# Patient Record
Sex: Female | Born: 1990
Health system: Southern US, Community
[De-identification: ages and names within clinical notes are randomized; demographics above are authoritative.]

## PROBLEM LIST (undated history)

## (undated) DIAGNOSIS — U071 COVID-19: Secondary | ICD-10-CM

## (undated) DIAGNOSIS — F32A Depression, unspecified: Secondary | ICD-10-CM

## (undated) DIAGNOSIS — F329 Major depressive disorder, single episode, unspecified: Secondary | ICD-10-CM

## (undated) HISTORY — DX: Depression, unspecified: F32.A

## (undated) HISTORY — DX: Major depressive disorder, single episode, unspecified: F32.9

---

## 2016-05-10 LAB — HM PAP SMEAR: HM Pap smear: NEGATIVE

## 2017-10-13 ENCOUNTER — Ambulatory Visit: Payer: Self-pay | Admitting: Family Medicine

## 2017-11-10 ENCOUNTER — Ambulatory Visit (INDEPENDENT_AMBULATORY_CARE_PROVIDER_SITE_OTHER): Payer: 59 | Admitting: Family Medicine

## 2017-11-10 ENCOUNTER — Encounter: Payer: Self-pay | Admitting: Family Medicine

## 2017-11-10 ENCOUNTER — Encounter (INDEPENDENT_AMBULATORY_CARE_PROVIDER_SITE_OTHER): Payer: Self-pay

## 2017-11-10 VITALS — BP 118/72 | HR 84 | Temp 98.1°F | Ht 62.25 in | Wt 314.8 lb

## 2017-11-10 DIAGNOSIS — Z720 Tobacco use: Secondary | ICD-10-CM

## 2017-11-10 DIAGNOSIS — Z113 Encounter for screening for infections with a predominantly sexual mode of transmission: Secondary | ICD-10-CM

## 2017-11-10 DIAGNOSIS — Z7689 Persons encountering health services in other specified circumstances: Secondary | ICD-10-CM

## 2017-11-10 DIAGNOSIS — Z0000 Encounter for general adult medical examination without abnormal findings: Secondary | ICD-10-CM

## 2017-11-10 LAB — COMPREHENSIVE METABOLIC PANEL
ALBUMIN: 3.9 g/dL (ref 3.5–5.2)
ALK PHOS: 78 U/L (ref 39–117)
ALT: 21 U/L (ref 0–35)
AST: 13 U/L (ref 0–37)
BUN: 11 mg/dL (ref 6–23)
CO2: 27 mEq/L (ref 19–32)
Calcium: 9 mg/dL (ref 8.4–10.5)
Chloride: 105 mEq/L (ref 96–112)
Creatinine, Ser: 0.76 mg/dL (ref 0.40–1.20)
GFR: 115.74 mL/min (ref >60.00)
GLUCOSE: 103 mg/dL — AB (ref 70–99)
POTASSIUM: 4 meq/L (ref 3.5–5.1)
SODIUM: 138 meq/L (ref 135–145)
TOTAL PROTEIN: 7.7 g/dL (ref 6.0–8.3)
Total Bilirubin: 0.2 mg/dL (ref 0.2–1.2)

## 2017-11-10 LAB — CBC WITH DIFFERENTIAL/PLATELET
BASOS PCT: 0.5 % (ref 0.0–3.0)
Basophils Absolute: 0 10*3/uL (ref 0.0–0.1)
EOS PCT: 1.4 % (ref 0.0–5.0)
Eosinophils Absolute: 0.1 10*3/uL (ref 0.0–0.7)
HCT: 37.2 % (ref 36.0–46.0)
Hemoglobin: 12.1 g/dL (ref 12.0–15.0)
LYMPHS ABS: 2 10*3/uL (ref 0.7–4.0)
Lymphocytes Relative: 23.3 % (ref 12.0–46.0)
MCHC: 32.6 g/dL (ref 30.0–36.0)
MCV: 76.9 fl — AB (ref 78.0–100.0)
MONO ABS: 0.7 10*3/uL (ref 0.1–1.0)
Monocytes Relative: 8.5 % (ref 3.0–12.0)
NEUTROS ABS: 5.6 10*3/uL (ref 1.4–7.7)
NEUTROS PCT: 66.3 % (ref 43.0–77.0)
Platelets: 290 10*3/uL (ref 150.0–400.0)
RBC: 4.83 Mil/uL (ref 3.87–5.11)
RDW: 16.1 % — AB (ref 11.5–15.5)
WBC: 8.5 10*3/uL (ref 4.0–10.5)

## 2017-11-10 LAB — LIPID PANEL
CHOL/HDL RATIO: 4
Cholesterol: 162 mg/dL (ref 0–200)
HDL: 43.9 mg/dL (ref >39.00)
LDL Cholesterol: 102 mg/dL — ABNORMAL HIGH (ref 0–99)
NonHDL: 118.11
TRIGLYCERIDES: 80 mg/dL (ref 0.0–149.0)
VLDL: 16 mg/dL (ref 0.0–40.0)

## 2017-11-10 LAB — TSH: TSH: 2.26 u[IU]/mL (ref 0.35–4.50)

## 2017-11-10 LAB — VITAMIN D 25 HYDROXY (VIT D DEFICIENCY, FRACTURES): VITD: <7 ng/mL — ABNORMAL LOW (ref 30.00–100.00)

## 2017-11-10 LAB — HEMOGLOBIN A1C: HEMOGLOBIN A1C: 5.8 % (ref 4.6–6.5)

## 2017-11-10 NOTE — Patient Instructions (Signed)
Great to see you today  Please follow up in 6 months  I will notify you of lab results  Work to eat 50-60 grams of protein daily (lean meats, eggs, greek yogurt, cottage cheese, whey protein shakes), eat 5-7 servings of vegetables a day and 1-2 servings of fruit daily. Eat whole grains. Aim for 1/2 to 1 pound a week  Keeping You Healthy  Get These Tests 1. Blood Pressure- Have your blood pressure checked once a year by your health care provider.  Normal blood pressure is 120/80. 2. Weight- Have your body mass index (BMI) calculated to screen for obesity.  BMI is measure of body fat based on height and weight.  You can also calculate your own BMI at https://www.west-esparza.com/www.nhlbisupport.com/bmi/. 3. Cholesterol- Have your cholesterol checked every 5 years starting at age 920 then yearly starting at age 29. 4. Chlamydia, HIV, and other sexually transmitted diseases- Get screened every year until age 29, then within three months of each new sexual provider. 5. Pap Test - Every 1-5 years; discuss with your health care provider. 6. Mammogram- Every 1-2 years starting at age 29--50  Take these medicines  Calcium with Vitamin D-Your body needs 1200 mg of Calcium each day and [phone] IU of Vitamin D daily.  Your body can only absorb 500 mg of Calcium at a time so Calcium must be taken in 2 or 3 divided doses throughout the day.  Multivitamin with folic acid- Once daily if it is possible for you to become pregnant.  Get these Immunizations  Gardasil-Series of three doses; prevents HPV related illness such as genital warts and cervical cancer.  Menactra-Single dose; prevents meningitis.  Tetanus shot- Every 10 years.  Flu shot-Every year.  Take these steps 1. Do not smoke-Your healthcare provider can help you quit.  For tips on how to quit go to www.smokefree.gov or call 1-800 QUITNOW. 2. Be physically active- Exercise 5 days a week for at least 30 minutes.  If you are not already physically active, start slow  and gradually work up to 30 minutes of moderate physical activity.  Examples of moderate activity include walking briskly, dancing, swimming, bicycling, etc. 3. Breast Cancer- A self breast exam every month is important for early detection of breast cancer.  For more information and instruction on self breast exams, ask your healthcare provider or [place].eswww.womenshealth.gov/faq/breast-self-exam.cfm. 4. Eat a healthy diet- Eat a variety of healthy foods such as fruits, vegetables, whole grains, low fat milk, low fat cheeses, yogurt, lean meats, poultry and fish, beans, nuts, tofu, etc.  For more information go to www. Thenutritionsource.org 5. Drink alcohol in moderation- Limit alcohol intake to one drink or less per day. Never drink and drive. 6. Depression- Your emotional health is as important as your physical health.  If you're feeling down or losing interest in things you normally enjoy please talk to your healthcare provider about being screened for depression. 7. Dental visit- Brush and floss your teeth twice daily; visit your dentist twice a year. 8. Eye doctor- Get an eye exam at least every 2 years. 9. Helmet use- Always wear a helmet when riding a bicycle, motorcycle, rollerblading or skateboarding. 10. Safe sex- If you may be exposed to sexually transmitted infections, use a condom. 11. Seat belts- Seat belts can save your live; always wear one. 12. Smoke/Carbon Monoxide detectors- These detectors need to be installed on the appropriate level of your home. Replace batteries at least once a year. 13. Skin cancer- When out in  the sun please cover up and use sunscreen 15 SPF or higher. 14. Violence- If anyone is threatening or hurting you, please tell your healthcare provider.

## 2017-11-10 NOTE — Progress Notes (Signed)
Subjective:    Patient ID: Valerie Mccarthy, female    DOB: 1990/12/06, 27 y.o.   MRN: [id number]  HPI This is a 27 yo female who presents today to establish care. Recently moved to area from [place]. 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 Lives with her mother who is disabled. They are currently living in a hotel while awaiting more permanent housing. Haven't been able to cook. She enjoys movies, socializing with friends. She would like to have her own catering business one day.   Last CPE- last year, no recent blood work Pap- last year Sex- not currently sexually active, has been in past, would like std testing Tdap- unknown Flu- annual Eye- last year Dental- over due Exercise- going to gym 3-5 days a week, does treadmill and elipitcal.  Diet- currently doing intermittent fasting. Eats salad for lunch, usually food from work for dinner. Snacks on chips or cookies. Has recently stopped drinking soda (was drinking 2-3 a day), little juice, mostly drinks water.  Tobacco abuse- doing smoking cessation class at [facility], mother also smokes but is supportive of her quitting.    Past Medical History:  Diagnosis Date  . Depression    History reviewed. No pertinent surgical history. Family History  Problem Relation Age of Onset  . Obesity Mother   . Diabetes Mother   . Arthritis Mother   . Lupus Mother   . Heart disease Father    Social History   Tobacco Use  . Smoking status: Light Tobacco Smoker  . Smokeless tobacco: Never Used  Substance Use Topics  . Alcohol use: Yes    Comment: 2-3 weekly   . Drug use: Never      Review of Systems  Constitutional: Negative.   HENT: Negative.   Eyes: Negative.        Wears glasses.   Respiratory: Negative.   Cardiovascular: Negative.   Gastrointestinal: Negative.   Endocrine: Negative.   Genitourinary: Negative.   Musculoskeletal: Negative.   Allergic/Immunologic: Negative.   Neurological: Positive  for headaches (1-2 per week, usually related to stress, have decreased since getting glasses. ).  Hematological: Negative.   Psychiatric/Behavioral: Negative.        Objective:   Physical Exam Physical Exam  Constitutional: She is oriented to person, place, and time. She appears well-developed and well-nourished. No distress.  HENT:  Head: Normocephalic and atraumatic.  Right Ear: External ear normal.  Left Ear: External ear normal.  Nose: Nose normal.  Mouth/Throat: Oropharynx is clear and moist. No oropharyngeal exudate.  Eyes: Conjunctivae are normal. Pupils are equal, round, and reactive to light.  Neck: Normal range of motion. Neck supple. No JVD present. No thyromegaly present.  Cardiovascular: Normal rate, regular rhythm, normal heart sounds and intact distal pulses.   Pulmonary/Chest: Effort normal and breath sounds normal. Right breast exhibits no inverted nipple, no mass, no nipple discharge, no skin change and no tenderness. Left breast exhibits no inverted nipple, no mass, no nipple discharge, no skin change and no tenderness. Breasts are symmetrical.  Abdominal: Soft. Bowel sounds are normal. She exhibits no distension and no mass. There is no tenderness. There is no rebound and no guarding.  Musculoskeletal: Normal range of motion. She exhibits no edema or tenderness.  Lymphadenopathy:    She has no cervical adenopathy.  Neurological: She is alert and oriented to person, place, and time. She has normal reflexes.  Skin: Skin is warm and dry. She is not diaphoretic.  Psychiatric:  She has a normal mood and affect. Her behavior is normal. Judgment and thought content normal.  Vitals reviewed.     BP 118/72 (BP Location: Right Arm, Patient Position: Sitting, Cuff Size: Large)   Pulse 84   Temp 98.1 F (36.7 C) (Oral)   Ht 5' 2.25" (1.581 m)   Wt (!) 314 lb 12 oz (142.8 kg)   LMP 11/03/2017   SpO2 99%   BMI 57.11 kg/m  Depression screen PHQ 2/9 11/10/2017  Decreased  Interest 0  Down, Depressed, Hopeless 0  PHQ - 2 Score 0       Assessment & Plan:  1. Encounter to establish care - Discussed and encouraged healthy lifestyle choices- adequate sleep, regular exercise, stress management and healthy food choices.  - will request records from prior PCP  2. Screening for STD (sexually transmitted disease) - RPR - HIV antibody - C. trachomatis/N. gonorrhoeae RNA  3. Morbid obesity (HCC) - TSH - CBC with Differential - Comprehensive metabolic panel - Hemoglobin A1c - Vitamin D, 25-hydroxy - Lipid Panel - discussed weight loss of 1/2 -1 pound per week, encouraged her to increase protein and vegetable intake, continue to abstain from soda/juice.  - follow up in 6 months to check on progress  4. Tobacco abuse - encouraged her to continue participation in smoking cessation program  Olean Reeeborah Phuong Moffatt, FNP-BC  [facility], [facility]  11/10/2017 10:52 AM

## 2017-11-14 LAB — C. TRACHOMATIS/N. GONORRHOEAE RNA
C. trachomatis RNA, TMA: NOT DETECTED
N. gonorrhoeae RNA, TMA: NOT DETECTED

## 2017-11-14 LAB — HIV ANTIBODY (ROUTINE TESTING W REFLEX): HIV 1&2 Ab, 4th Generation: NONREACTIVE

## 2017-11-14 LAB — RPR: RPR Ser Ql: NONREACTIVE

## 2017-11-15 MED ORDER — VITAMIN D (ERGOCALCIFEROL) 1.25 MG (50000 UNIT) PO CAPS: 50000.0000 [IU] | ORAL_CAPSULE | ORAL | 3 refills | Status: DC

## 2017-11-15 NOTE — Addendum Note (Signed)
Addended by: Olean ReeGESSNER, DEBORAH B on: 11/15/2017 08:18 AM   Modules accepted: Orders

## 2017-11-16 ENCOUNTER — Encounter: Payer: Self-pay | Admitting: Family Medicine

## 2017-11-22 ENCOUNTER — Emergency Department
Admission: EM | Admit: 2017-11-22 | Discharge: 2017-11-22 | Disposition: A | Discharge status: Home / Self Care | Payer: 59 | Attending: Emergency Medicine | Admitting: Emergency Medicine

## 2017-11-22 ENCOUNTER — Encounter: Payer: Self-pay | Admitting: Emergency Medicine

## 2017-11-22 ENCOUNTER — Other Ambulatory Visit: Payer: Self-pay

## 2017-11-22 DIAGNOSIS — F329 Major depressive disorder, single episode, unspecified: Secondary | ICD-10-CM | POA: Diagnosis not present

## 2017-11-22 DIAGNOSIS — F17200 Nicotine dependence, unspecified, uncomplicated: Secondary | ICD-10-CM | POA: Insufficient documentation

## 2017-11-22 DIAGNOSIS — F331 Major depressive disorder, recurrent, moderate: Secondary | ICD-10-CM

## 2017-11-22 DIAGNOSIS — F32A Depression, unspecified: Secondary | ICD-10-CM

## 2017-11-22 DIAGNOSIS — R45851 Suicidal ideations: Secondary | ICD-10-CM | POA: Insufficient documentation

## 2017-11-22 LAB — CBC
HCT: 34.4 % — ABNORMAL LOW (ref 35.0–47.0)
Hemoglobin: 11.6 g/dL — ABNORMAL LOW (ref 12.0–16.0)
MCH: 25.5 pg — AB (ref 26.0–34.0)
MCHC: 33.8 g/dL (ref 32.0–36.0)
MCV: 75.6 fL — ABNORMAL LOW (ref 80.0–100.0)
Platelets: 311 10*3/uL (ref 150–440)
RBC: 4.54 MIL/uL (ref 3.80–5.20)
RDW: 16 % — AB (ref 11.5–14.5)
WBC: 12.2 10*3/uL — ABNORMAL HIGH (ref 3.6–11.0)

## 2017-11-22 LAB — URINE DRUG SCREEN, QUALITATIVE (ARMC ONLY)
Amphetamines, Ur Screen: NOT DETECTED
BARBITURATES, UR SCREEN: NOT DETECTED
BENZODIAZEPINE, UR SCRN: NOT DETECTED
COCAINE METABOLITE, UR ~~LOC~~: NOT DETECTED
Cannabinoid 50 Ng, Ur ~~LOC~~: NOT DETECTED
MDMA (Ecstasy)Ur Screen: NOT DETECTED
Methadone Scn, Ur: NOT DETECTED
Opiate, Ur Screen: NOT DETECTED
PHENCYCLIDINE (PCP) UR S: NOT DETECTED
Tricyclic, Ur Screen: NOT DETECTED

## 2017-11-22 LAB — COMPREHENSIVE METABOLIC PANEL
ALT: 34 U/L (ref 0–44)
AST: 20 U/L (ref 15–41)
Albumin: 3.6 g/dL (ref 3.5–5.0)
Alkaline Phosphatase: 75 U/L (ref 38–126)
Anion gap: 6 (ref 5–15)
BUN: 13 mg/dL (ref 6–20)
CHLORIDE: 109 mmol/L (ref 98–111)
CO2: 24 mmol/L (ref 22–32)
CREATININE: 0.79 mg/dL (ref 0.44–1.00)
Calcium: 8.7 mg/dL — ABNORMAL LOW (ref 8.9–10.3)
GFR calc Af Amer: >60 mL/min (ref >60)
GFR calc non Af Amer: >60 mL/min (ref >60)
Glucose, Bld: 128 mg/dL — ABNORMAL HIGH (ref 70–99)
Potassium: 4 mmol/L (ref 3.5–5.1)
Sodium: 139 mmol/L (ref 135–145)
Total Bilirubin: 0.4 mg/dL (ref 0.3–1.2)
Total Protein: 7.5 g/dL (ref 6.5–8.1)

## 2017-11-22 LAB — PREGNANCY, URINE: Preg Test, Ur: NEGATIVE

## 2017-11-22 LAB — ETHANOL

## 2017-11-22 LAB — SALICYLATE LEVEL: Salicylate Lvl: <7 mg/dL (ref 2.8–30.0)

## 2017-11-22 LAB — ACETAMINOPHEN LEVEL: Acetaminophen (Tylenol), Serum: <10 ug/mL — ABNORMAL LOW (ref 10–30)

## 2017-11-22 NOTE — ED Notes (Signed)
Patient given phone to call supervisor, patient was not able to get in contact with supervisor, she said she will try again later.

## 2017-11-22 NOTE — Discharge Instructions (Signed)
Please follow-up with employee assistance and/or [facility].  Please return for any further problems.

## 2017-11-22 NOTE — BH Assessment (Signed)
Assessment Note  Valerie Mccarthy is an 27 y.o. female brought to ED via [facility] voluntary. Pt reports that she recently had suicidal thoughts that she expressed to a friend due to stress associated with loss and family conflicts. Pt reports that her friend then called the police out of concern. Pt tearful, coherent, and alert during assessment. Pt reports that she has a difficult relationship with her mom and wishes it were better. Pt lost her father two years ago and endorses many symptoms of grief. Pt feels her mother is withdrawn and cold due to loss of her father and her grandmother being killed by her grandfather when her mother was a teenager. Pt feels her mother constantly puts her down and argues with her which contributes to her feelings of depression. At time of assessment, pt endorsed no SI, HI, AH, and VH. Pt denies any access to weapons or prior attempts.  Diagnosis: Depression  Past Medical History:  Past Medical History:  Diagnosis Date  . Depression     History reviewed. No pertinent surgical history.  Family History:  Family History  Problem Relation Age of Onset  . Obesity Mother   . Diabetes Mother   . Arthritis Mother   . Lupus Mother   . Heart disease Father     Social History:  reports that she has been smoking.  She has never used smokeless tobacco. She reports that she drinks alcohol. She reports that she does not use drugs.  Additional Social History:  Alcohol / Drug Use Pain Medications: see PTA Prescriptions: see PTA Over the Counter: see PTA History of alcohol / drug use?: No history of alcohol / drug abuse  CIWA:   COWS:    Allergies: No Known Allergies  Home Medications:  (Not in a hospital admission)  OB/GYN Status:  Patient's last menstrual period was 11/03/2017 (exact date).  General Assessment Data Location of Assessment: [facility] ED TTS Assessment: In system Is this a Tele or Face-to-Face Assessment?: Face-to-Face Is this an Initial Assessment  or a Re-assessment for this encounter?: Initial Assessment Marital status: Single Is patient pregnant?: No Pregnancy Status: No Living Arrangements: Parent Can pt return to current living arrangement?: Yes Admission Status: Voluntary Is patient capable of signing voluntary admission?: Yes Referral Source: Self/Family/Friend Insurance type: [employer]  Medical Screening Exam [facility] Walk-in ONLY) Medical Exam completed: Yes  Crisis Care Plan Living Arrangements: Parent Legal Guardian: Other:(self) Name of Psychiatrist: none Name of Therapist: none  Education Status Is patient currently in school?: No Is the patient employed, unemployed or receiving disability?: Employed  Risk to self with the past 6 months Suicidal Ideation: No-Not Currently/Within Last 6 Months Has patient been a risk to self within the past 6 months prior to admission? : No Suicidal Intent: No Has patient had any suicidal intent within the past 6 months prior to admission? : No Is patient at risk for suicide?: No, but patient needs Medical Clearance Suicidal Plan?: No Has patient had any suicidal plan within the past 6 months prior to admission? : No Access to Means: No What has been your use of drugs/alcohol within the last 12 months?: none Previous Attempts/Gestures: No How many times?: 0 Other Self Harm Risks: none reported Triggers for Past Attempts: None known Intentional Self Injurious Behavior: None Family Suicide History: No Recent stressful life event(s): Conflict (Comment), Loss (Comment), Financial Problems Persecutory voices/beliefs?: No Depression: Yes Depression Symptoms: Despondent, Insomnia, Tearfulness, Isolating, Loss of interest in usual pleasures, Feeling worthless/self pity  Substance abuse history and/or treatment for substance abuse?: No Suicide prevention information given to non-admitted patients: Not applicable  Risk to Others within the past 6 months Homicidal Ideation:  No Does patient have any lifetime risk of violence toward others beyond the six months prior to admission? : No Thoughts of Harm to Others: No Current Homicidal Intent: No Current Homicidal Plan: No Access to Homicidal Means: No Identified Victim: None identified History of harm to others?: No Assessment of Violence: None Noted Violent Behavior Description: none noted Does patient have access to weapons?: No Criminal Charges Pending?: No Does patient have a court date: No Is patient on probation?: No  Psychosis Hallucinations: None noted Delusions: None noted  Mental Status Report Appearance/Hygiene: Unremarkable Eye Contact: Good Motor Activity: Freedom of movement Speech: Logical/coherent Level of Consciousness: Alert Mood: Depressed Affect: Appropriate to circumstance Anxiety Level: None Thought Processes: Coherent, Relevant Judgement: Unimpaired Orientation: Appropriate for developmental age Obsessive Compulsive Thoughts/Behaviors: None  Cognitive Functioning Concentration: Good Memory: Recent Intact, Remote Intact Is patient IDD: No Is patient DD?: No Insight: Fair Impulse Control: Fair Appetite: Good Have you had any weight changes? : No Change Sleep: Decreased Total Hours of Sleep: 6 Vegetative Symptoms: None  ADLScreening [facility] Assessment Services) Patient's cognitive ability adequate to safely complete daily activities?: Yes Patient able to express need for assistance with ADLs?: Yes Independently performs ADLs?: Yes (appropriate for developmental age)  Prior Inpatient Therapy Prior Inpatient Therapy: No  Prior Outpatient Therapy Prior Outpatient Therapy: No Does patient have an ACCT team?: No Does patient have Intensive In-House Services?  : No Does patient have [facility] services? : No Does patient have P4CC services?: No  ADL Screening (condition at time of admission) Patient's cognitive ability adequate to safely complete daily activities?:  Yes Is the patient deaf or have difficulty hearing?: No Does the patient have difficulty seeing, even when wearing glasses/contacts?: No Does the patient have difficulty concentrating, remembering, or making decisions?: No Patient able to express need for assistance with ADLs?: Yes Does the patient have difficulty dressing or bathing?: No Independently performs ADLs?: Yes (appropriate for developmental age) Does the patient have difficulty walking or climbing stairs?: No Weakness of Legs: None Weakness of Arms/Hands: None  Home Assistive Devices/Equipment Home Assistive Devices/Equipment: None  Therapy Consults (therapy consults require a physician order) PT Evaluation Needed: No OT Evalulation Needed: No SLP Evaluation Needed: No Abuse/Neglect Assessment (Assessment to be complete while patient is alone) Abuse/Neglect Assessment Can Be Completed: Yes Physical Abuse: Denies Verbal Abuse: Denies Sexual Abuse: Denies Exploitation of patient/patient's resources: Denies Self-Neglect: Denies Values / Beliefs Cultural Requests During Hospitalization: None Spiritual Requests During Hospitalization: None Consults Spiritual Care Consult Needed: No Social Work Consult Needed: No Merchant navy officerAdvance Directives (For Healthcare) Does Patient Have a Medical Advance Directive?: No Would patient like information on creating a medical advance directive?: No - Patient declined    Additional Information 1:1 In Past 12 Months?: No CIRT Risk: No Elopement Risk: No Does patient have medical clearance?: Yes     Disposition:  Disposition Initial Assessment Completed for this Encounter: Yes Disposition of Patient: (pending disposition) Patient refused recommended treatment: No Mode of transportation if patient is discharged?: Car Patient referred to: (pending disposition)  On Site Evaluation by:   Reviewed with Physician:    Aubery LappingJerrica  Brielynn Sekula, MS, [facility] 11/22/2017 6:41 AM

## 2017-11-22 NOTE — ED Notes (Signed)
Hourly rounding reveals patient sleeping in room. No complaints, stable, in no acute distress. Q15 minute rounds and monitoring via Security to continue. 

## 2017-11-22 NOTE — ED Notes (Signed)
Pt. Alert and oriented, warm and dry, in no distress. Pt. Denies HI, and AVH. Pt states having SI without a plan. Patient verbally contracts with this Clinical research associatewriter for safety. Pt states she has recently moved in with her mom and since they have moved into an hotel trying to find somewhere else better to live than previous place. Patient states she has tried to talk with her mom, but her mother just shuts it down and does not want to take about what is bothering her. Patient states she is wanting help for depression. Pt. Encouraged to let nursing staff know of any concerns or needs.

## 2017-11-22 NOTE — ED Notes (Signed)
EDT, Beth, in triage for protocols; Grey short-sleeve shirt, black pants, underwear, black jacket, black flip flops, striped purse removed and placed in labeled pt belonging bag to be secured on nursing unit; pt dressed in burgandy scrubs; pt's mother given pt password and list of behav unit visitation guidelines per pt's permission

## 2017-11-22 NOTE — ED Notes (Signed)
ED Provider at bedside. 

## 2017-11-22 NOTE — ED Notes (Signed)
Valerie ButtnerMonisa Mccarthy [id number]([phone]); pt's mother reports this is 2nd anniversary of her father's death and the end of a 7year relationship and has been struggling since; [facility] but was only an outpatient visit; moved here in November to stay with her mom

## 2017-11-22 NOTE — ED Triage Notes (Addendum)
Patient ambulatory to triage with steady gait, without difficulty or distress noted; pt reports suicidal thoughts; pt reports hx of depression but never rx meds for such

## 2017-11-22 NOTE — ED Notes (Signed)
Patient discharged home, patient received discharge papers. Patient received belongings and verbalized she has received all of her belongings. Patient appropriate and cooperative, Denies SI/HI AVH. Vital signs taken. NAD noted. 

## 2017-11-22 NOTE — Consult Note (Signed)
[facility] Consult   Reason for Consult: Consult for this 27 year old woman who came to the emergency room after referral by her friend because of concern about suicidal ideation Referring Physician: Rip Harbour Patient Identification: Valerie Mccarthy MRN:  [id number] Principal Diagnosis: Moderate recurrent major depression ([place]) Diagnosis:   Patient Active Problem List   Diagnosis Date Noted  . Moderate recurrent major depression ([place]) [F33.1] 11/22/2017    Total Time spent with patient: 1 hour  Subjective:   Valerie Mccarthy is a 27 y.o. female patient admitted with "my friend was just worried about me" patient interviewed chart reviewed.  Patient reports that she has been feeling down and depressed intermittently for many months now.  She does not feel constantly depressed but her mood will come and go.  She.  HPI: Feels that most of her mood problems come from the stress she is under living at home.  Patient lives with her mother and her stepfather and they are all currently staying in a hotel room.  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  She feels like she is under a lot of stress.  She feels like she has made a lot of sacrifices to try to take care of her mother and does not get any appreciation for it.  Patient is not sleeping as well as she could.  Appetite is okay.  She admits that she had said to her friend that she had some thoughts about "not wanting to be here" but denies that she had any plan or intention of hurting herself.  Denies to me any actual suicidal ideation and has positive things she can site in her life.  She drinks only occasionally and was not drinking yesterday.  Denies other drug use.  Not currently getting any outpatient mental health treatment.  No psychotic symptoms.  Social history: Patient is working but lives with her disabled mother and some other members of the extended family and what sounds like pretty stressful circumstances.   Patient told me a little bit more about her early life and it sounds like there is been a lot of stress and struggle that she has gone through for years.  Medical history: Overweight  Substance abuse history: Occasional alcohol but none on the screen when she came in and drug screen was negative.  Past Psychiatric History: Patient has no past history of any psychiatric evaluation or treatment.  No history of suicide attempts or self injury.  No history of psychiatric medication.  Risk to Self: Suicidal Ideation: No-Not Currently/Within Last 6 Months Suicidal Intent: No Is patient at risk for suicide?: No, but patient needs Medical Clearance Suicidal Plan?: No Access to Means: No What has been your use of drugs/alcohol within the last 12 months?: none How many times?: 0 Other Self Harm Risks: none reported Triggers for Past Attempts: None known Intentional Self Injurious Behavior: None Risk to Others: Homicidal Ideation: No Thoughts of Harm to Others: No Current Homicidal Intent: No Current Homicidal Plan: No Access to Homicidal Means: No Identified Victim: None identified History of harm to others?: No Assessment of Violence: None Noted Violent Behavior Description: none noted Does patient have access to weapons?: No Criminal Charges Pending?: No Does patient have a court date: No Prior Inpatient Therapy: Prior Inpatient Therapy: No Prior Outpatient Therapy: Prior Outpatient Therapy: No Does patient have an ACCT team?: No Does patient have Intensive In-House Services?  : No Does patient have [facility] services? : No Does patient have  P4CC services?: No  Past Medical History:  Past Medical History:  Diagnosis Date  . Depression    History reviewed. No pertinent surgical history. Family History:  Family History  Problem Relation Age of Onset  . Obesity Mother   . Diabetes Mother   . Arthritis Mother   . Lupus Mother   . Heart disease Father    Family Psychiatric   History: No known family history of mental health problems Social History:  Social History   Substance and Sexual Activity  Alcohol Use Yes   Comment: 2-3 weekly      Social History   Substance and Sexual Activity  Drug Use Never    Social History   Socioeconomic History  . Marital status: Single    Spouse name: Not on file  . Number of children: Not on file  . Years of education: Not on file  . Highest education level: Not on file  Occupational History  . Not on file  Social Needs  . Financial resource strain: Not on file  . Food insecurity:    Worry: Not on file    Inability: Not on file  . Transportation needs:    Medical: Not on file    Non-medical: Not on file  Tobacco Use  . Smoking status: Light Tobacco Smoker  . Smokeless tobacco: Never Used  Substance and Sexual Activity  . Alcohol use: Yes    Comment: 2-3 weekly   . Drug use: Never  . Sexual activity: Yes    Partners: Male    Birth control/protection: None  Lifestyle  . Physical activity:    Days per week: Not on file    Minutes per session: Not on file  . Stress: Not on file  Relationships  . Social connections:    Talks on phone: Not on file    Gets together: Not on file    Attends religious service: Not on file    Active member of club or organization: Not on file    Attends meetings of clubs or organizations: Not on file    Relationship status: Not on file  Other Topics Concern  . Not on file  Social History Narrative  . Not on file   Additional Social History:    Allergies:  No Known Allergies  Labs:  Results for orders placed or performed during the hospital encounter of 11/22/17 (from the past 48 hour(s))  Comprehensive metabolic panel     Status: Abnormal   Collection Time: 11/22/17  2:35 AM  Result Value Ref Range   Sodium 139 135 - 145 mmol/L   Potassium 4.0 3.5 - 5.1 mmol/L   Chloride 109 98 - 111 mmol/L   CO2 24 22 - 32 mmol/L   Glucose, Bld 128 (H) 70 - 99 mg/dL   BUN 13  6 - 20 mg/dL   Creatinine, Ser 0.79 0.44 - 1.00 mg/dL   Calcium 8.7 (L) 8.9 - 10.3 mg/dL   Total Protein 7.5 6.5 - 8.1 g/dL   Albumin 3.6 3.5 - 5.0 g/dL   AST 20 15 - 41 U/L   ALT 34 0 - 44 U/L   Alkaline Phosphatase 75 38 - 126 U/L   Total Bilirubin 0.4 0.3 - 1.2 mg/dL   GFR calc non Af Amer >60 >60 mL/min   GFR calc Af Amer >60 >60 mL/min    Comment: (NOTE) The eGFR has been calculated using the CKD EPI equation. This calculation has not been validated in all  clinical situations. eGFR's persistently <60 mL/min signify possible Chronic Kidney Disease.    Anion gap 6 5 - 15    Comment: Performed at [facility], [address] [place], [place] [address]  Ethanol     Status: None   Collection Time: 11/22/17  2:35 AM  Result Value Ref Range   Alcohol, Ethyl (B) <10 <10 mg/dL    Comment: (NOTE) Lowest detectable limit for serum alcohol is 10 mg/dL. For medical purposes only. Performed at [facility], [address] [place], [place] [address]   Salicylate level     Status: None   Collection Time: 11/22/17  2:35 AM  Result Value Ref Range   Salicylate Lvl <3.2 2.8 - 30.0 mg/dL    Comment: Performed at [facility], [address] [place], [place] [address]  Acetaminophen level     Status: Abnormal   Collection Time: 11/22/17  2:35 AM  Result Value Ref Range   Acetaminophen (Tylenol), Serum <10 (L) 10 - 30 ug/mL    Comment: (NOTE) Therapeutic concentrations vary significantly. A range of 10-30 ug/mL  may be an effective concentration for many patients. However, some  are best treated at concentrations outside of this range. Acetaminophen concentrations >150 ug/mL at 4 hours after ingestion  and >50 ug/mL at 12 hours after ingestion are often associated with  toxic reactions. Performed at [facility], [address] [place], [place] [address]   cbc     Status: Abnormal   Collection Time: 11/22/17  2:35 AM  Result Value Ref Range    WBC 12.2 (H) 3.6 - 11.0 K/uL   RBC 4.54 3.80 - 5.20 MIL/uL   Hemoglobin 11.6 (L) 12.0 - 16.0 g/dL   HCT 34.4 (L) 35.0 - 47.0 %   MCV 75.6 (L) 80.0 - 100.0 fL   MCH 25.5 (L) 26.0 - 34.0 pg   MCHC 33.8 32.0 - 36.0 g/dL   RDW 16.0 (H) 11.5 - 14.5 %   Platelets 311 150 - 440 K/uL    Comment: Performed at [facility], [address], [place],  [address]  Urine Drug Screen, Qualitative     Status: None   Collection Time: 11/22/17  2:41 AM  Result Value Ref Range   Tricyclic, Ur Screen NONE DETECTED NONE DETECTED   Amphetamines, Ur Screen NONE DETECTED NONE DETECTED   MDMA (Ecstasy)Ur Screen NONE DETECTED NONE DETECTED   Cocaine Metabolite,Ur [place] NONE DETECTED NONE DETECTED   Opiate, Ur Screen NONE DETECTED NONE DETECTED   Phencyclidine (PCP) Ur S NONE DETECTED NONE DETECTED   Cannabinoid 50 Ng, Ur [place] NONE DETECTED NONE DETECTED   Barbiturates, Ur Screen NONE DETECTED NONE DETECTED   Benzodiazepine, Ur Scrn NONE DETECTED NONE DETECTED   Methadone Scn, Ur NONE DETECTED NONE DETECTED    Comment: (NOTE) Tricyclics + metabolites, urine    Cutoff 1000 ng/mL Amphetamines + metabolites, urine  Cutoff 1000 ng/mL MDMA (Ecstasy), urine              Cutoff 500 ng/mL Cocaine Metabolite, urine          Cutoff 300 ng/mL Opiate + metabolites, urine        Cutoff 300 ng/mL Phencyclidine (PCP), urine         Cutoff 25 ng/mL Cannabinoid, urine                 Cutoff 50 ng/mL Barbiturates + metabolites, urine  Cutoff 200 ng/mL Benzodiazepine, urine  Cutoff 200 ng/mL Methadone, urine                   Cutoff 300 ng/mL The urine drug screen provides only a preliminary, unconfirmed analytical test result and should not be used for non-medical purposes. Clinical consideration and professional judgment should be applied to any positive drug screen result due to possible interfering substances. A more specific alternate chemical method must be used in order to obtain a  confirmed analytical result. Gas chromatography / mass spectrometry (GC/MS) is the preferred confirmat ory method. Performed at [facility], [address] [place], [place] [address]   Pregnancy, urine     Status: None   Collection Time: 11/22/17  2:41 AM  Result Value Ref Range   Preg Test, Ur NEGATIVE NEGATIVE    Comment: Performed at [facility], [address] [place],  [address]    No current facility-administered medications for this encounter.    Current Outpatient Medications  Medication Sig Dispense Refill  . Vitamin D, Ergocalciferol, (DRISDOL) 50000 units CAPS capsule Take 1 capsule (50,000 Units total) by mouth every 7 (seven) days. 12 capsule 3    Musculoskeletal: Strength & Muscle Tone: within normal limits Gait & Station: normal Patient leans: N/A  Psychiatric Specialty Exam: Physical Exam  Nursing note and vitals reviewed. Constitutional: She appears well-developed and well-nourished.  HENT:  Head: Normocephalic and atraumatic.  Eyes: Pupils are equal, round, and reactive to light. Conjunctivae are normal.  Neck: Normal range of motion.  Cardiovascular: Regular rhythm and normal heart sounds.  Respiratory: Effort normal. No respiratory distress.  GI: Soft.  Musculoskeletal: Normal range of motion.  Neurological: She is alert.  Skin: Skin is warm and dry.  Psychiatric: Judgment normal. Her affect is blunt. Her speech is delayed. She is slowed. Thought content is not paranoid. Cognition and memory are normal. She expresses no homicidal and no suicidal ideation.    Review of Systems  Constitutional: Negative.   HENT: Negative.   Eyes: Negative.   Respiratory: Negative.   Cardiovascular: Negative.   Gastrointestinal: Negative.   Musculoskeletal: Negative.   Skin: Negative.   Neurological: Negative.   Psychiatric/Behavioral: Positive for depression. Negative for hallucinations, memory loss, substance abuse and suicidal  ideas. The patient is not nervous/anxious and does not have insomnia.     Height '5\' 2"'  (1.575 m), weight (!) 142.4 kg (314 lb), last menstrual period 11/03/2017.Body mass index is 57.43 kg/m.  General Appearance: Fairly Groomed  Eye Contact:  Good  Speech:  Clear and Coherent  Volume:  Normal  Mood:  Dysphoric  Affect:  Full Range  Thought Process:  Coherent  Orientation:  Full (Time, Place, and Person)  Thought Content:  Logical  Suicidal Thoughts:  No  Homicidal Thoughts:  No  Memory:  Immediate;   Good Recent;   Fair Remote;   Fair  Judgement:  Fair  Insight:  Fair  Psychomotor Activity:  Decreased  Concentration:  Concentration: Fair  Recall:  [employer] of Knowledge:  Fair  Language:  Fair  Akathisia:  No  Handed:  Right  AIMS (if indicated):     Assets:  Desire for Improvement Housing Physical Health Resilience  ADL's:  Intact  Cognition:  WNL  Sleep:        Treatment Plan Summary: Plan 27 year old woman with some symptoms of depression but they are not consistent and she has been continuing to function pretty well.  No actual suicidal ideation no indication  of psychosis.  Patient does not meet commitment criteria and does not require inpatient treatment.  Talked about appropriate treatment for this.  She will be given referrals to local mental health and any other agencies that can be of assistance in trying to get her into see a therapist.  Case reviewed with emergency room doctor and TTS.  No indication right now to start any medicine necessarily.  Disposition: No evidence of imminent risk to self or others at present.   Patient does not meet criteria for psychiatric inpatient admission. Supportive therapy provided about ongoing stressors. Discussed crisis plan, support from social network, calling 911, coming to the Emergency Department, and calling Suicide Hotline.  Alethia Berthold, MD 11/22/2017 2:11 PM

## 2017-11-22 NOTE — ED Notes (Signed)

## 2017-11-22 NOTE — ED Provider Notes (Signed)
[facility] Emergency Department Provider Note   ____________________________________________   First MD Initiated Contact with Patient 11/22/17 0300     (approximate)  I have reviewed the triage vital signs and the nursing notes.   HISTORY  Chief Complaint Mental Health Problem    HPI Valerie Mccarthy is a 27 y.o. female who presents to the ED from home with a chief complaint of depression with vague suicidal thoughts without plan.  Patient reports a history of depression but has never taken any medications for it.  No prior psychiatric hospitalizations.  Told her friend tonight that she was thinking of hurting herself and the friend brought her to the ED.  Currently denies active SI/HI/AH/VH.  Voices no medical complaints.   Past Medical History:  Diagnosis Date  . Depression     There are no active problems to display for this patient.   History reviewed. No pertinent surgical history.  Prior to Admission medications   Medication Sig Start Date End Date Taking? Authorizing Provider  Vitamin D, Ergocalciferol, (DRISDOL) 50000 units CAPS capsule Take 1 capsule (50,000 Units total) by mouth every 7 (seven) days. 11/15/17   Emi BelfastGessner, Deborah B, FNP    Allergies Patient has no known allergies.  Family History  Problem Relation Age of Onset  . Obesity Mother   . Diabetes Mother   . Arthritis Mother   . Lupus Mother   . Heart disease Father     Social History Social History   Tobacco Use  . Smoking status: Light Tobacco Smoker  . Smokeless tobacco: Never Used  Substance Use Topics  . Alcohol use: Yes    Comment: 2-3 weekly   . Drug use: Never    Review of Systems  Constitutional: No fever/chills Eyes: No visual changes. ENT: No sore throat. Cardiovascular: Denies chest pain. Respiratory: Denies shortness of breath. Gastrointestinal: No abdominal pain.  No nausea, no vomiting.  No diarrhea.  No constipation. Genitourinary: Negative  for dysuria. Musculoskeletal: Negative for back pain. Skin: Negative for rash. Neurological: Negative for headaches, focal weakness or numbness. Psychiatric:Positive for depression with vague SI.   ____________________________________________   PHYSICAL EXAM:  VITAL SIGNS: ED Triage Vitals [11/22/17 0230]  Enc Vitals Group     BP      Pulse      Resp      Temp      Temp src      SpO2      Weight (!) 314 lb (142.4 kg)     Height 5\' 2"  (1.575 m)     Head Circumference      Peak Flow      Pain Score 0     Pain Loc      Pain Edu?      Excl. in GC?     Constitutional: Alert and oriented. Well appearing and in no acute distress. Eyes: Conjunctivae are normal. PERRL. EOMI. Head: Atraumatic. Nose: No congestion/rhinnorhea. Mouth/Throat: Mucous membranes are moist.  Oropharynx non-erythematous. Neck: No stridor.   Cardiovascular: Normal rate, regular rhythm. Grossly normal heart sounds.  Good peripheral circulation. Respiratory: Normal respiratory effort.  No retractions. Lungs CTAB. Gastrointestinal: Soft and nontender. No distention. No abdominal bruits. No CVA tenderness. Musculoskeletal: No lower extremity tenderness nor edema.  No joint effusions. Neurologic:  Normal speech and language. No gross focal neurologic deficits are appreciated. No gait instability. Skin:  Skin is warm, dry and intact. No rash noted. Psychiatric: Mood and affect are flat. Speech and  behavior are normal.  ____________________________________________   LABS (all labs ordered are listed, but only abnormal results are displayed)  Labs Reviewed  COMPREHENSIVE METABOLIC PANEL - Abnormal; Notable for the following components:      Result Value   Glucose, Bld 128 (*)    Calcium 8.7 (*)    All other components within normal limits  ACETAMINOPHEN LEVEL - Abnormal; Notable for the following components:   Acetaminophen (Tylenol), Serum <10 (*)    All other components within normal limits  CBC -  Abnormal; Notable for the following components:   WBC 12.2 (*)    Hemoglobin 11.6 (*)    HCT 34.4 (*)    MCV 75.6 (*)    MCH 25.5 (*)    RDW 16.0 (*)    All other components within normal limits  ETHANOL  SALICYLATE LEVEL  URINE DRUG SCREEN, QUALITATIVE ([facility] ONLY)  PREGNANCY, URINE   ____________________________________________  EKG  None ____________________________________________  RADIOLOGY  ED MD interpretation: None  Official radiology report(s): No results found.  ____________________________________________   PROCEDURES  Procedure(s) performed: None  Procedures  Critical Care performed: No  ____________________________________________   INITIAL IMPRESSION / ASSESSMENT AND PLAN / ED COURSE  As part of my medical decision making, I reviewed the following data within the electronic MEDICAL RECORD NUMBER Nursing notes reviewed and incorporated, Labs reviewed, Old chart reviewed, A consult was requested and obtained from this/these consultant(s) Psychiatry and Notes from prior ED visits   27 year old female who presents with depression and vague suicidal thoughts without plan.  Contracts for safety in the emergency department.  Will keep patient under voluntary status pending psychiatry evaluation and disposition.  Clinical Course as of Nov 22 640  Wed Nov 22, 2017  4098 Laboratory results noted.  Patient is medically cleared and may be able to be transferred to the [facility] pending psychiatric evaluation and disposition.   [JS]    Clinical Course User Index [JS] Irean Hong, MD     ____________________________________________   FINAL CLINICAL IMPRESSION(S) / ED DIAGNOSES  Final diagnoses:  Depression, unspecified depression type     ED Discharge Orders    None       Note:  This document was prepared using Dragon voice recognition software and may include unintentional dictation errors.    Irean Hong, MD 11/22/17 [phone]

## 2018-04-05 ENCOUNTER — Telehealth: Payer: Self-pay | Admitting: Family Medicine

## 2018-04-05 NOTE — Telephone Encounter (Signed)
Left message asking pt to call office please r/s 1/22 appointment with debbie

## 2018-05-06 ENCOUNTER — Other Ambulatory Visit: Payer: Self-pay | Admitting: Family Medicine

## 2018-05-06 DIAGNOSIS — R7303 Prediabetes: Secondary | ICD-10-CM

## 2018-05-06 DIAGNOSIS — E559 Vitamin D deficiency, unspecified: Secondary | ICD-10-CM

## 2018-05-06 NOTE — Progress Notes (Signed)
Labs for CPE entered

## 2018-05-08 ENCOUNTER — Encounter: Payer: Self-pay | Admitting: Family Medicine

## 2018-05-09 ENCOUNTER — Other Ambulatory Visit: Payer: 59

## 2018-05-10 ENCOUNTER — Telehealth: Payer: Self-pay | Admitting: Family Medicine

## 2018-05-10 NOTE — Telephone Encounter (Signed)
Received fax from [facility] that patient called to cancel appt. I tried calling patient back due to Dr. Milinda Antis being out on 05/16/18 and rescheduled her appt. I received her voicemail and left message for pt to call us and reschedule.

## 2018-05-16 ENCOUNTER — Encounter: Payer: 59 | Admitting: Family Medicine

## 2018-05-30 ENCOUNTER — Other Ambulatory Visit (INDEPENDENT_AMBULATORY_CARE_PROVIDER_SITE_OTHER): Payer: Commercial Managed Care - PPO

## 2018-05-30 DIAGNOSIS — R7303 Prediabetes: Secondary | ICD-10-CM | POA: Diagnosis not present

## 2018-05-30 DIAGNOSIS — E559 Vitamin D deficiency, unspecified: Secondary | ICD-10-CM | POA: Diagnosis not present

## 2018-05-30 LAB — HEMOGLOBIN A1C: Hgb A1c MFr Bld: 5.5 % (ref 4.6–6.5)

## 2018-05-30 LAB — VITAMIN D 25 HYDROXY (VIT D DEFICIENCY, FRACTURES): VITD: 7.46 ng/mL — AB (ref 30.00–100.00)

## 2018-06-06 ENCOUNTER — Encounter: Payer: Self-pay | Admitting: Family Medicine

## 2018-06-06 ENCOUNTER — Other Ambulatory Visit: Payer: Self-pay

## 2018-06-06 ENCOUNTER — Ambulatory Visit (INDEPENDENT_AMBULATORY_CARE_PROVIDER_SITE_OTHER): Payer: Commercial Managed Care - PPO | Admitting: Family Medicine

## 2018-06-06 VITALS — BP 118/72 | HR 81 | Temp 97.5°F | Resp 18 | Ht 62.0 in | Wt 298.1 lb

## 2018-06-06 DIAGNOSIS — E559 Vitamin D deficiency, unspecified: Secondary | ICD-10-CM | POA: Diagnosis not present

## 2018-06-06 DIAGNOSIS — F331 Major depressive disorder, recurrent, moderate: Secondary | ICD-10-CM

## 2018-06-06 DIAGNOSIS — R7303 Prediabetes: Secondary | ICD-10-CM

## 2018-06-06 DIAGNOSIS — Z0000 Encounter for general adult medical examination without abnormal findings: Secondary | ICD-10-CM

## 2018-06-06 DIAGNOSIS — Z720 Tobacco use: Secondary | ICD-10-CM | POA: Diagnosis not present

## 2018-06-06 MED ORDER — VITAMIN D3 1.25 MG (50000 UT) PO TABS: 1.0000 | ORAL_TABLET | ORAL | 3 refills | Status: DC

## 2018-06-06 NOTE — Progress Notes (Signed)
Subjective:    Patient ID: Valerie Mccarthy, female    DOB: [birth year], 28 y.o.   MRN: [id number]  HPI This is a 28 yo female who presents today for CPE. Reports that she is doing well. 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    Last CPE- last 1-2 years Pap- 05/10/2016 Tdap- not sure, she works for [employer] and thinks had for job Flu- annual Eye- last 1-2 years Exercise- exercising at work with her coworkers 4-5 times a week Sex- not currently in a relationship, declines STD testing  Depression- was seen in ED 7/19. She reports that she is doing better. She is not living with her mother anymore which is better for her. She does get lonely sometimes. Denies SI.   Obesity- is doing intermittent fasting and exercising more regularly. Has lost 16 pounds since last visit.   Tobacco- smokes 1/2 ppd, tried to quit with program at work, was not successful. Feels that her stress level is a barrier to quitting.   Vitamin D deficiency- never started supplementation, willing to start now   Past Medical History:  Diagnosis Date  . Depression    No past surgical history on file. Family History  Problem Relation Age of Onset  . Obesity Mother   . Diabetes Mother   . Arthritis Mother   . Lupus Mother   . Heart disease Father    Social History   Tobacco Use  . Smoking status: Light Tobacco Smoker  . Smokeless tobacco: Never Used  Substance Use Topics  . Alcohol use: Yes    Comment: 2-3 weekly   . Drug use: Never      Review of Systems  Constitutional: Positive for activity change (increased). Negative for fatigue, fever and unexpected weight change.  HENT: Negative.   Eyes: Negative.   Respiratory: Negative.   Cardiovascular: Negative.   Gastrointestinal: Negative.   Endocrine: Negative.   Genitourinary: Negative.   Musculoskeletal: Negative.   Skin: Negative.   Allergic/Immunologic: Negative.   Neurological: Positive for  headaches (infrequent).  Hematological: Negative.   Psychiatric/Behavioral: Positive for dysphoric mood and sleep disturbance (difficulty going to sleep).       Objective:   Physical Exam Vitals signs reviewed.  Constitutional:      General: She is not in acute distress.    Appearance: Normal appearance. She is obese. She is not ill-appearing, toxic-appearing or diaphoretic.  HENT:     Head: Normocephalic and atraumatic.     Right Ear: Tympanic membrane, ear canal and external ear normal.     Left Ear: Tympanic membrane, ear canal and external ear normal.     Nose: Nose normal.     Mouth/Throat:     Mouth: Mucous membranes are moist.     Pharynx: Oropharynx is clear.  Eyes:     Conjunctiva/sclera: Conjunctivae normal.     Pupils: Pupils are equal, round, and reactive to light.  Neck:     Musculoskeletal: Normal range of motion and neck supple. No neck rigidity or muscular tenderness.  Cardiovascular:     Rate and Rhythm: Normal rate and regular rhythm.     Heart sounds: Normal heart sounds.  Pulmonary:     Effort: Pulmonary effort is normal.     Breath sounds: Normal breath sounds.  Chest:     Breasts:        Right: Normal. No swelling, bleeding, inverted nipple, mass, nipple discharge, skin  change or tenderness.        Left: Normal. No swelling, bleeding, inverted nipple, mass, nipple discharge, skin change or tenderness.  Abdominal:     General: Bowel sounds are normal. There is no distension.     Palpations: Abdomen is soft.     Tenderness: There is no abdominal tenderness. There is no guarding or rebound.  Musculoskeletal:     Right lower leg: No edema.     Left lower leg: No edema.  Lymphadenopathy:     Cervical: No cervical adenopathy.     Upper Body:     Right upper body: No supraclavicular, axillary or pectoral adenopathy.     Left upper body: No supraclavicular, axillary or pectoral adenopathy.  Skin:    General: Skin is warm.  Neurological:     Mental  Status: She is alert and oriented to person, place, and time.  Psychiatric:        Mood and Affect: Mood normal.        Behavior: Behavior normal.        Thought Content: Thought content normal.        Judgment: Judgment normal.       BP 118/72 (BP Location: Right Arm, Patient Position: Sitting, Cuff Size: Large)   Pulse 81   Temp (!) 97.5 F (36.4 C) (Oral)   Resp 18   Ht 5\' 2"  (1.575 m)   Wt 298 lb 1.6 oz (135.2 kg)   LMP 05/28/2018   SpO2 99%   BMI 54.52 kg/m  Wt Readings from Last 3 Encounters:  06/06/18 298 lb 1.6 oz (135.2 kg)  11/10/17 (!) 314 lb 12 oz (142.8 kg)   Depression screen [facility] 2/9 06/06/2018 11/10/2017  Decreased Interest 0 0  Down, Depressed, Hopeless 1 0  PHQ - 2 Score 1 0       Assessment & Plan:  1. Annual physical exam - Discussed and encouraged healthy lifestyle choices- adequate sleep, regular exercise, stress management and healthy food choices.  - Discussed work life balance and encouraged her to seek social outlets and make time for leisure.   2. Vitamin D deficiency - Cholecalciferol (VITAMIN D3) 1.25 MG (50000 UT) TABS; Take 1 tablet by mouth every 7 (seven) days.  Dispense: 12 tablet; Refill: 3  3. Moderate recurrent major depression (HCC) - improved from several months ago, discussed therapy, medication, exercise, diet, yoga/mindfullness. Encouraged her to RTC if symptoms worsen  4. Morbid obesity (HCC) - has been exercising more frequently and working on diet, encouraged her efforts - follow up in 6 months  5. Tobacco abuse - counseled on importance of cessation and provided written and verbal information  6. Prediabetes - improved hgba1c with diet and exercise   Olean Ree, FNP-BC  [facility], [facility]  06/07/2018 9:31 AM

## 2018-06-06 NOTE — Patient Instructions (Addendum)
Good to see you today  Please follow up in 6 months to follow up on weight loss and vitamin D  I have sent a weekly vitamin D supplement to your pharmacy   Insomnia Insomnia is a sleep disorder that makes it difficult to fall asleep or stay asleep. Insomnia can cause fatigue, low energy, difficulty concentrating, mood swings, and poor performance at work or school. There are three different ways to classify insomnia:  Difficulty falling asleep.  Difficulty staying asleep.  Waking up too early in the morning. Any type of insomnia can be long-term (chronic) or short-term (acute). Both are common. Short-term insomnia usually lasts for three months or less. Chronic insomnia occurs at least three times a week for longer than three months. What are the causes? Insomnia may be caused by another condition, situation, or substance, such as:  Anxiety.  Certain medicines.  Gastroesophageal reflux disease (GERD) or other gastrointestinal conditions.  Asthma or other breathing conditions.  Restless legs syndrome, sleep apnea, or other sleep disorders.  Chronic pain.  Menopause.  Stroke.  Abuse of alcohol, tobacco, or illegal drugs.  Mental health conditions, such as depression.  Caffeine.  Neurological disorders, such as Alzheimer's disease.  An overactive thyroid (hyperthyroidism). Sometimes, the cause of insomnia may not be known. What increases the risk? Risk factors for insomnia include:  Gender. Women are affected more often than men.  Age. Insomnia is more common as you get older.  Stress.  Lack of exercise.  Irregular work schedule or working night shifts.  Traveling between different time zones.  Certain medical and mental health conditions. What are the signs or symptoms? If you have insomnia, the main symptom is having trouble falling asleep or having trouble staying asleep. This may lead to other symptoms, such as:  Feeling fatigued or having low  energy.  Feeling nervous about going to sleep.  Not feeling rested in the morning.  Having trouble concentrating.  Feeling irritable, anxious, or depressed. How is this diagnosed? This condition may be diagnosed based on:  Your symptoms and medical history. Your health care provider may ask about: ? Your sleep habits. ? Any medical conditions you have. ? Your mental health.  A physical exam. How is this treated? Treatment for insomnia depends on the cause. Treatment may focus on treating an underlying condition that is causing insomnia. Treatment may also include:  Medicines to help you sleep.  Counseling or therapy.  Lifestyle adjustments to help you sleep better. Follow these instructions at home: Eating and drinking   Limit or avoid alcohol, caffeinated beverages, and cigarettes, especially close to bedtime. These can disrupt your sleep.  Do not eat a large meal or eat spicy foods right before bedtime. This can lead to digestive discomfort that can make it hard for you to sleep. Sleep habits   Keep a sleep diary to help you and your health care provider figure out what could be causing your insomnia. Write down: ? When you sleep. ? When you wake up during the night. ? How well you sleep. ? How rested you feel the next day. ? Any side effects of medicines you are taking. ? What you eat and drink.  Make your bedroom a dark, comfortable place where it is easy to fall asleep. ? Put up shades or blackout curtains to block light from outside. ? Use a white noise machine to block noise. ? Keep the temperature cool.  Limit screen use before bedtime. This includes: ? Watching TV. ?  Using your smartphone, tablet, or computer.  Stick to a routine that includes going to bed and waking up at the same times every day and night. This can help you fall asleep faster. Consider making a quiet activity, such as reading, part of your nighttime routine.  Try to avoid taking naps  during the day so that you sleep better at night.  Get out of bed if you are still awake after 15 minutes of trying to sleep. Keep the lights down, but try reading or doing a quiet activity. When you feel sleepy, go back to bed. General instructions  Take over-the-counter and prescription medicines only as told by your health care provider.  Exercise regularly, as told by your health care provider. Avoid exercise starting several hours before bedtime.  Use relaxation techniques to manage stress. Ask your health care provider to suggest some techniques that may work well for you. These may include: ? Breathing exercises. ? Routines to release muscle tension. ? Visualizing peaceful scenes.  Make sure that you drive carefully. Avoid driving if you feel very sleepy.  Keep all follow-up visits as told by your health care provider. This is important. Contact a health care provider if:  You are tired throughout the day.  You have trouble in your daily routine due to sleepiness.  You continue to have sleep problems, or your sleep problems get worse. Get help right away if:  You have serious thoughts about hurting yourself or someone else. If you ever feel like you may hurt yourself or others, or have thoughts about taking your own life, get help right away. You can go to your nearest emergency department or call:  Your local emergency services (911 in the [place]  A suicide crisis helpline, such as the [facility] at 478-400-0736. This is open 24 hours a day. Summary  Insomnia is a sleep disorder that makes it difficult to fall asleep or stay asleep.  Insomnia can be long-term (chronic) or short-term (acute).  Treatment for insomnia depends on the cause. Treatment may focus on treating an underlying condition that is causing insomnia.  Keep a sleep diary to help you and your health care provider figure out what could be causing your insomnia. This  information is not intended to replace advice given to you by your health care provider. Make sure you discuss any questions you have with your health care provider. Document Released: 04/08/2000 Document Revised: 01/19/2017 Document Reviewed: 01/19/2017 Elsevier Interactive Patient Education  2019 [place] with Quitting Smoking  Quitting smoking is a physical and mental challenge. You will face cravings, withdrawal symptoms, and temptation. Before quitting, work with your health care provider to make a plan that can help you cope. Preparation can help you quit and keep you from giving in. How can I cope with cravings? Cravings usually last for 5-10 minutes. If you get through it, the craving will pass. Consider taking the following actions to help you cope with cravings:  Keep your mouth busy: ? Chew sugar-free gum. ? Suck on hard candies or a straw. ? Brush your teeth.  Keep your hands and body busy: ? Immediately change to a different activity when you feel a craving. ? Squeeze or play with a ball. ? Do an activity or a hobby, like making bead jewelry, practicing needlepoint, or working with wood. ? Mix up your normal routine. ? Take a short exercise break. Go for a quick walk or run up and down stairs. ?  Spend time in public places where smoking is not allowed.  Focus on doing something kind or helpful for someone else.  Call a friend or family member to talk during a craving.  Join a support group.  Call a quit line, such as 1-800-QUIT-NOW.  Talk with your health care provider about medicines that might help you cope with cravings and make quitting easier for you. How can I deal with withdrawal symptoms? Your body may experience negative effects as it tries to get used to not having nicotine in the system. These effects are called withdrawal symptoms. They may include:  Feeling hungrier than normal.  Trouble concentrating.  Irritability.  Trouble  sleeping.  Feeling depressed.  Restlessness and agitation.  Craving a cigarette. To manage withdrawal symptoms:  Avoid places, people, and activities that trigger your cravings.  Remember why you want to quit.  Get plenty of sleep.  Avoid coffee and other caffeinated drinks. These may worsen some of your symptoms. How can I handle social situations? Social situations can be difficult when you are quitting smoking, especially in the first few weeks. To manage this, you can:  Avoid parties, bars, and other social situations where people might be smoking.  Avoid alcohol.  Leave right away if you have the urge to smoke.  Explain to your family and friends that you are quitting smoking. Ask for understanding and support.  Plan activities with friends or family where smoking is not an option. What are some ways I can cope with stress? Wanting to smoke may cause stress, and stress can make you want to smoke. Find ways to manage your stress. Relaxation techniques can help. For example:  Breathe slowly and deeply, in through your nose and out through your mouth.  Listen to soothing, relaxing music.  Talk with a family member or friend about your stress.  Light a candle.  Soak in a bath or take a shower.  Think about a peaceful place. What are some ways I can prevent weight gain? Be aware that many people gain weight after they quit smoking. However, not everyone does. To keep from gaining weight, have a plan in place before you quit and stick to the plan after you quit. Your plan should include:  Having healthy snacks. When you have a craving, it may help to: ? Eat plain popcorn, crunchy carrots, celery, or other cut vegetables. ? Chew sugar-free gum.  Changing how you eat: ? Eat small portion sizes at meals. ? Eat 4-6 small meals throughout the day instead of 1-2 large meals a day. ? Be mindful when you eat. Do not watch television or do other things that might distract  you as you eat.  Exercising regularly: ? Make time to exercise each day. If you do not have time for a long workout, do short bouts of exercise for 5-10 minutes several times a day. ? Do some form of strengthening exercise, like weight lifting, and some form of aerobic exercise, like running or swimming.  Drinking plenty of water or other low-calorie or no-calorie drinks. Drink 6-8 glasses of water daily, or as much as instructed by your health care provider. Summary  Quitting smoking is a physical and mental challenge. You will face cravings, withdrawal symptoms, and temptation to smoke again. Preparation can help you as you go through these challenges.  You can cope with cravings by keeping your mouth busy (such as by chewing gum), keeping your body and hands busy, and making calls to family,  friends, or a helpline for people who want to quit smoking.  You can cope with withdrawal symptoms by avoiding places where people smoke, avoiding drinks with caffeine, and getting plenty of rest.  Ask your health care provider about the different ways to prevent weight gain, avoid stress, and handle social situations. This information is not intended to replace advice given to you by your health care provider. Make sure you discuss any questions you have with your health care provider. Document Released: 04/08/2016 Document Revised: 04/08/2016 Document Reviewed: 04/08/2016 Elsevier Interactive Patient Education  2019 [place] Maintenance, Female Adopting a healthy lifestyle and getting preventive care can go a long way to promote health and wellness. Talk with your health care provider about what schedule of regular examinations is right for you. This is a good chance for you to check in with your provider about disease prevention and staying healthy. In between checkups, there are plenty of things you can do on your own. Experts have done a lot of research about which lifestyle changes and  preventive measures are most likely to keep you healthy. Ask your health care provider for more information. Weight and diet Eat a healthy diet  Be sure to include plenty of vegetables, fruits, low-fat dairy products, and lean protein.  Do not eat a lot of foods high in solid fats, added sugars, or salt.  Get regular exercise. This is one of the most important things you can do for your health. ? Most adults should exercise for at least 150 minutes each week. The exercise should increase your heart rate and make you sweat (moderate-intensity exercise). ? Most adults should also do strengthening exercises at least twice a week. This is in addition to the moderate-intensity exercise. Maintain a healthy weight  Body mass index (BMI) is a measurement that can be used to identify possible weight problems. It estimates body fat based on height and weight. Your health care provider can help determine your BMI and help you achieve or maintain a healthy weight.  For females 71 years of age and older: ? A BMI below 18.5 is considered underweight. ? A BMI of 18.5 to 24.9 is normal. ? A BMI of 25 to 29.9 is considered overweight. ? A BMI of 30 and above is considered obese. Watch levels of cholesterol and blood lipids  You should start having your blood tested for lipids and cholesterol at 30 years of age, then have this test every 5 years.  You may need to have your cholesterol levels checked more often if: ? Your lipid or cholesterol levels are high. ? You are older than 30 years of age. ? You are at high risk for heart disease. Cancer screening Lung Cancer  Lung cancer screening is recommended for adults 33-9 years old who are at high risk for lung cancer because of a history of smoking.  A yearly low-dose CT scan of the lungs is recommended for people who: ? Currently smoke. ? Have quit within the past 15 years. ? Have at least a 30-pack-year history of smoking. A pack year is smoking an  average of one pack of cigarettes a day for 1 year.  Yearly screening should continue until it has been 15 years since you quit.  Yearly screening should stop if you develop a health problem that would prevent you from having lung cancer treatment. Breast Cancer  Practice breast self-awareness. This means understanding how your breasts normally appear and feel.  It also means  doing regular breast self-exams. Let your health care provider know about any changes, no matter how small.  If you are in your 20s or 30s, you should have a clinical breast exam (CBE) by a health care provider every 1-3 years as part of a regular health exam.  If you are 73 or older, have a CBE every year. Also consider having a breast X-ray (mammogram) every year.  If you have a family history of breast cancer, talk to your health care provider about genetic screening.  If you are at high risk for breast cancer, talk to your health care provider about having an MRI and a mammogram every year.  Breast cancer gene (BRCA) assessment is recommended for women who have family members with BRCA-related cancers. BRCA-related cancers include: ? Breast. ? Ovarian. ? Tubal. ? Peritoneal cancers.  Results of the assessment will determine the need for genetic counseling and BRCA1 and BRCA2 testing. Cervical Cancer Your health care provider may recommend that you be screened regularly for cancer of the pelvic organs (ovaries, uterus, and vagina). This screening involves a pelvic examination, including checking for microscopic changes to the surface of your cervix (Pap test). You may be encouraged to have this screening done every 3 years, beginning at age 74.  For women ages 28-65, health care providers may recommend pelvic exams and Pap testing every 3 years, or they may recommend the Pap and pelvic exam, combined with testing for human papilloma virus (HPV), every 5 years. Some types of HPV increase your risk of cervical  cancer. Testing for HPV may also be done on women of any age with unclear Pap test results.  Other health care providers may not recommend any screening for nonpregnant women who are considered low risk for pelvic cancer and who do not have symptoms. Ask your health care provider if a screening pelvic exam is right for you.  If you have had past treatment for cervical cancer or a condition that could lead to cancer, you need Pap tests and screening for cancer for at least 20 years after your treatment. If Pap tests have been discontinued, your risk factors (such as having a new sexual partner) need to be reassessed to determine if screening should resume. Some women have medical problems that increase the chance of getting cervical cancer. In these cases, your health care provider may recommend more frequent screening and Pap tests. Colorectal Cancer  This type of cancer can be detected and often prevented.  Routine colorectal cancer screening usually begins at 30 years of age and continues through 30 years of age.  Your health care provider may recommend screening at an earlier age if you have risk factors for colon cancer.  Your health care provider may also recommend using home test kits to check for hidden blood in the stool.  A small camera at the end of a tube can be used to examine your colon directly (sigmoidoscopy or colonoscopy). This is done to check for the earliest forms of colorectal cancer.  Routine screening usually begins at age 3.  Direct examination of the colon should be repeated every 5-10 years through 30 years of age. However, you may need to be screened more often if early forms of precancerous polyps or small growths are found. Skin Cancer  Check your skin from head to toe regularly.  Tell your health care provider about any new moles or changes in moles, especially if there is a change in a mole's shape or color.  Also tell your health care provider if you have a  mole that is larger than the size of a pencil eraser.  Always use sunscreen. Apply sunscreen liberally and repeatedly throughout the day.  Protect yourself by wearing long sleeves, pants, a wide-brimmed hat, and sunglasses whenever you are outside. Heart disease, diabetes, and high blood pressure  High blood pressure causes heart disease and increases the risk of stroke. High blood pressure is more likely to develop in: ? People who have blood pressure in the high end of the normal range (130-139/85-89 mm Hg). ? People who are overweight or obese. ? People who are African American.  If you are 110-86 years of age, have your blood pressure checked every 3-5 years. If you are 63 years of age or older, have your blood pressure checked every year. You should have your blood pressure measured twice-once when you are at a hospital or clinic, and once when you are not at a hospital or clinic. Record the average of the two measurements. To check your blood pressure when you are not at a hospital or clinic, you can use: ? An automated blood pressure machine at a pharmacy. ? A home blood pressure monitor.  If you are between 26 years and 84 years old, ask your health care provider if you should take aspirin to prevent strokes.  Have regular diabetes screenings. This involves taking a blood sample to check your fasting blood sugar level. ? If you are at a normal weight and have a low risk for diabetes, have this test once every three years after 30 years of age. ? If you are overweight and have a high risk for diabetes, consider being tested at a younger age or more often. Preventing infection Hepatitis B  If you have a higher risk for hepatitis B, you should be screened for this virus. You are considered at high risk for hepatitis B if: ? You were born in a country where hepatitis B is common. Ask your health care provider which countries are considered high risk. ? Your parents were born in a  high-risk country, and you have not been immunized against hepatitis B (hepatitis B vaccine). ? You have HIV or AIDS. ? You use needles to inject street drugs. ? You live with someone who has hepatitis B. ? You have had sex with someone who has hepatitis B. ? You get hemodialysis treatment. ? You take certain medicines for conditions, including cancer, organ transplantation, and autoimmune conditions. Hepatitis C  Blood testing is recommended for: ? Everyone born from [birth year] through 1967. ? Anyone with known risk factors for hepatitis C. Sexually transmitted infections (STIs)  You should be screened for sexually transmitted infections (STIs) including gonorrhea and chlamydia if: ? You are sexually active and are younger than 30 years of age. ? You are older than 30 years of age and your health care provider tells you that you are at risk for this type of infection. ? Your sexual activity has changed since you were last screened and you are at an increased risk for chlamydia or gonorrhea. Ask your health care provider if you are at risk.  If you do not have HIV, but are at risk, it may be recommended that you take a prescription medicine daily to prevent HIV infection. This is called pre-exposure prophylaxis (PrEP). You are considered at risk if: ? You are sexually active and do not regularly use condoms or know the HIV status of your partner(s). ?  You take drugs by injection. ? You are sexually active with a partner who has HIV. Talk with your health care provider about whether you are at high risk of being infected with HIV. If you choose to begin PrEP, you should first be tested for HIV. You should then be tested every 3 months for as long as you are taking PrEP. Pregnancy  If you are premenopausal and you may become pregnant, ask your health care provider about preconception counseling.  If you may become pregnant, take 400 to 800 micrograms (mcg) of folic acid every day.  If you want  to prevent pregnancy, talk to your health care provider about birth control (contraception). Osteoporosis and menopause  Osteoporosis is a disease in which the bones lose minerals and strength with aging. This can result in serious bone fractures. Your risk for osteoporosis can be identified using a bone density scan.  If you are 7 years of age or older, or if you are at risk for osteoporosis and fractures, ask your health care provider if you should be screened.  Ask your health care provider whether you should take a calcium or vitamin D supplement to lower your risk for osteoporosis.  Menopause may have certain physical symptoms and risks.  Hormone replacement therapy may reduce some of these symptoms and risks. Talk to your health care provider about whether hormone replacement therapy is right for you. Follow these instructions at home:  Schedule regular health, dental, and eye exams.  Stay current with your immunizations.  Do not use any tobacco products including cigarettes, chewing tobacco, or electronic cigarettes.  If you are pregnant, do not drink alcohol.  If you are breastfeeding, limit how much and how often you drink alcohol.  Limit alcohol intake to no more than 1 drink per day for nonpregnant women. One drink equals 12 ounces of beer, 5 ounces of wine, or 1 ounces of hard liquor.  Do not use street drugs.  Do not share needles.  Ask your health care provider for help if you need support or information about quitting drugs.  Tell your health care provider if you often feel depressed.  Tell your health care provider if you have ever been abused or do not feel safe at home. This information is not intended to replace advice given to you by your health care provider. Make sure you discuss any questions you have with your health care provider. Document Released: 10/25/2010 Document Revised: 09/17/2015 Document Reviewed: 01/13/2015 Elsevier Interactive Patient  Education  2019 [employer].

## 2018-06-06 NOTE — Progress Notes (Signed)
Subjective:    Patient ID: Valerie Mccarthy, female    DOB: Jan 03, 1991, 28 y.o.   MRN: [id number]  HPI This is a pleasant 28 yo female patient being seen today for her physical exam. She has made positive lifestyle changes to benefit her health, with diet and exercise. HgbA1C is now in the normal range at 5.5, she has lost 16 pounds and changed her diet.  Past Medical History:  Diagnosis Date  . Depression    No past surgical history on file. Family History  Problem Relation Age of Onset  . Obesity Mother   . Diabetes Mother   . Arthritis Mother   . Lupus Mother   . Heart disease Father    Social History   Tobacco Use  . Smoking status: Light Tobacco Smoker  . Smokeless tobacco: Never Used  Substance Use Topics  . Alcohol use: Yes    Comment: 2-3 weekly   . Drug use: Never     Review of Systems  Constitutional: Negative.   HENT: Negative.   Respiratory: Negative.   Cardiovascular: Negative.   Gastrointestinal: Negative.   Musculoskeletal: Negative.   Neurological: Negative.   Psychiatric/Behavioral:       States more 'down' days that 'up'   Per HPI    Objective:   Physical Exam Vitals signs reviewed.  Constitutional:      Appearance: Normal appearance. She is obese.  HENT:     Head: Normocephalic.     Right Ear: Tympanic membrane normal.     Ears:     Comments: Left TM dull    Nose: Nose normal.     Mouth/Throat:     Mouth: Mucous membranes are moist.  Cardiovascular:     Rate and Rhythm: Normal rate and regular rhythm.     Heart sounds: Normal heart sounds.  Pulmonary:     Effort: Pulmonary effort is normal.     Breath sounds: Normal breath sounds.  Abdominal:     Palpations: Abdomen is soft.  Skin:    General: Skin is warm and dry.  Neurological:     Mental Status: She is alert and oriented to person, place, and time.  Psychiatric:        Mood and Affect: Mood normal.        Behavior: Behavior normal. Behavior is cooperative.        Judgment:  Judgment normal.    BP 118/72 (BP Location: Right Arm, Patient Position: Sitting, Cuff Size: Large)   Pulse 81   Temp (!) 97.5 F (36.4 C) (Oral)   Resp 18   Ht 5' 2" (1.575 m)   Wt 298 lb 1.6 oz (135.2 kg)   LMP 05/28/2018   SpO2 99%   BMI 54.52 kg/m  BP Readings from Last 3 Encounters:  06/06/18 118/72  11/10/17 118/72   Wt Readings from Last 3 Encounters:  06/06/18 298 lb 1.6 oz (135.2 kg)  11/10/17 (!) 314 lb 12 oz (142.8 kg)          Assessment & Plan:  Vitamin D deficiency - Plan: Cholecalciferol (VITAMIN D3) 1.25 MG (50000 UT) TABS   Patient Instructions  Good to see you today  Please follow up in 6 months to follow up on weight loss and vitamin D  I have sent a weekly vitamin D supplement to your pharmacy   Insomnia Insomnia is a sleep disorder that makes it difficult to fall asleep or stay asleep. Insomnia can cause fatigue, low energy, difficulty  concentrating, mood swings, and poor performance at work or school. There are three different ways to classify insomnia:  Difficulty falling asleep.  Difficulty staying asleep.  Waking up too early in the morning. Any type of insomnia can be long-term (chronic) or short-term (acute). Both are common. Short-term insomnia usually lasts for three months or less. Chronic insomnia occurs at least three times a week for longer than three months. What are the causes? Insomnia may be caused by another condition, situation, or substance, such as:  Anxiety.  Certain medicines.  Gastroesophageal reflux disease (GERD) or other gastrointestinal conditions.  Asthma or other breathing conditions.  Restless legs syndrome, sleep apnea, or other sleep disorders.  Chronic pain.  Menopause.  Stroke.  Abuse of alcohol, tobacco, or illegal drugs.  Mental health conditions, such as depression.  Caffeine.  Neurological disorders, such as Alzheimer's disease.  An overactive thyroid (hyperthyroidism). Sometimes,  the cause of insomnia may not be known. What increases the risk? Risk factors for insomnia include:  Gender. Women are affected more often than men.  Age. Insomnia is more common as you get older.  Stress.  Lack of exercise.  Irregular work schedule or working night shifts.  Traveling between different time zones.  Certain medical and mental health conditions. What are the signs or symptoms? If you have insomnia, the main symptom is having trouble falling asleep or having trouble staying asleep. This may lead to other symptoms, such as:  Feeling fatigued or having low energy.  Feeling nervous about going to sleep.  Not feeling rested in the morning.  Having trouble concentrating.  Feeling irritable, anxious, or depressed. How is this diagnosed? This condition may be diagnosed based on:  Your symptoms and medical history. Your health care provider may ask about: ? Your sleep habits. ? Any medical conditions you have. ? Your mental health.  A physical exam. How is this treated? Treatment for insomnia depends on the cause. Treatment may focus on treating an underlying condition that is causing insomnia. Treatment may also include:  Medicines to help you sleep.  Counseling or therapy.  Lifestyle adjustments to help you sleep better. Follow these instructions at home: Eating and drinking   Limit or avoid alcohol, caffeinated beverages, and cigarettes, especially close to bedtime. These can disrupt your sleep.  Do not eat a large meal or eat spicy foods right before bedtime. This can lead to digestive discomfort that can make it hard for you to sleep. Sleep habits   Keep a sleep diary to help you and your health care provider figure out what could be causing your insomnia. Write down: ? When you sleep. ? When you wake up during the night. ? How well you sleep. ? How rested you feel the next day. ? Any side effects of medicines you are taking. ? What you eat and  drink.  Make your bedroom a dark, comfortable place where it is easy to fall asleep. ? Put up shades or blackout curtains to block light from outside. ? Use a white noise machine to block noise. ? Keep the temperature cool.  Limit screen use before bedtime. This includes: ? Watching TV. ? Using your smartphone, tablet, or computer.  Stick to a routine that includes going to bed and waking up at the same times every day and night. This can help you fall asleep faster. Consider making a quiet activity, such as reading, part of your nighttime routine.  Try to avoid taking naps during the day so  that you sleep better at night.  Get out of bed if you are still awake after 15 minutes of trying to sleep. Keep the lights down, but try reading or doing a quiet activity. When you feel sleepy, go back to bed. General instructions  Take over-the-counter and prescription medicines only as told by your health care provider.  Exercise regularly, as told by your health care provider. Avoid exercise starting several hours before bedtime.  Use relaxation techniques to manage stress. Ask your health care provider to suggest some techniques that may work well for you. These may include: ? Breathing exercises. ? Routines to release muscle tension. ? Visualizing peaceful scenes.  Make sure that you drive carefully. Avoid driving if you feel very sleepy.  Keep all follow-up visits as told by your health care provider. This is important. Contact a health care provider if:  You are tired throughout the day.  You have trouble in your daily routine due to sleepiness.  You continue to have sleep problems, or your sleep problems get worse. Get help right away if:  You have serious thoughts about hurting yourself or someone else. If you ever feel like you may hurt yourself or others, or have thoughts about taking your own life, get help right away. You can go to your nearest emergency department or  call:  Your local emergency services (911 in the [place]  A suicide crisis helpline, such as the [facility] at 775 869 5273. This is open 24 hours a day. Summary  Insomnia is a sleep disorder that makes it difficult to fall asleep or stay asleep.  Insomnia can be long-term (chronic) or short-term (acute).  Treatment for insomnia depends on the cause. Treatment may focus on treating an underlying condition that is causing insomnia.  Keep a sleep diary to help you and your health care provider figure out what could be causing your insomnia. This information is not intended to replace advice given to you by your health care provider. Make sure you discuss any questions you have with your health care provider. Document Released: 04/08/2000 Document Revised: 01/19/2017 Document Reviewed: 01/19/2017 Elsevier Interactive Patient Education  2019 [place] with Quitting Smoking  Quitting smoking is a physical and mental challenge. You will face cravings, withdrawal symptoms, and temptation. Before quitting, work with your health care provider to make a plan that can help you cope. Preparation can help you quit and keep you from giving in. How can I cope with cravings? Cravings usually last for 5-10 minutes. If you get through it, the craving will pass. Consider taking the following actions to help you cope with cravings:  Keep your mouth busy: ? Chew sugar-free gum. ? Suck on hard candies or a straw. ? Brush your teeth.  Keep your hands and body busy: ? Immediately change to a different activity when you feel a craving. ? Squeeze or play with a ball. ? Do an activity or a hobby, like making bead jewelry, practicing needlepoint, or working with wood. ? Mix up your normal routine. ? Take a short exercise break. Go for a quick walk or run up and down stairs. ? Spend time in public places where smoking is not allowed.  Focus on doing something kind or  helpful for someone else.  Call a friend or family member to talk during a craving.  Join a support group.  Call a quit line, such as 1-800-QUIT-NOW.  Talk with your health care provider about medicines that  might help you cope with cravings and make quitting easier for you. How can I deal with withdrawal symptoms? Your body may experience negative effects as it tries to get used to not having nicotine in the system. These effects are called withdrawal symptoms. They may include:  Feeling hungrier than normal.  Trouble concentrating.  Irritability.  Trouble sleeping.  Feeling depressed.  Restlessness and agitation.  Craving a cigarette. To manage withdrawal symptoms:  Avoid places, people, and activities that trigger your cravings.  Remember why you want to quit.  Get plenty of sleep.  Avoid coffee and other caffeinated drinks. These may worsen some of your symptoms. How can I handle social situations? Social situations can be difficult when you are quitting smoking, especially in the first few weeks. To manage this, you can:  Avoid parties, bars, and other social situations where people might be smoking.  Avoid alcohol.  Leave right away if you have the urge to smoke.  Explain to your family and friends that you are quitting smoking. Ask for understanding and support.  Plan activities with friends or family where smoking is not an option. What are some ways I can cope with stress? Wanting to smoke may cause stress, and stress can make you want to smoke. Find ways to manage your stress. Relaxation techniques can help. For example:  Breathe slowly and deeply, in through your nose and out through your mouth.  Listen to soothing, relaxing music.  Talk with a family member or friend about your stress.  Light a candle.  Soak in a bath or take a shower.  Think about a peaceful place. What are some ways I can prevent weight gain? Be aware that many people gain  weight after they quit smoking. However, not everyone does. To keep from gaining weight, have a plan in place before you quit and stick to the plan after you quit. Your plan should include:  Having healthy snacks. When you have a craving, it may help to: ? Eat plain popcorn, crunchy carrots, celery, or other cut vegetables. ? Chew sugar-free gum.  Changing how you eat: ? Eat small portion sizes at meals. ? Eat 4-6 small meals throughout the day instead of 1-2 large meals a day. ? Be mindful when you eat. Do not watch television or do other things that might distract you as you eat.  Exercising regularly: ? Make time to exercise each day. If you do not have time for a long workout, do short bouts of exercise for 5-10 minutes several times a day. ? Do some form of strengthening exercise, like weight lifting, and some form of aerobic exercise, like running or swimming.  Drinking plenty of water or other low-calorie or no-calorie drinks. Drink 6-8 glasses of water daily, or as much as instructed by your health care provider. Summary  Quitting smoking is a physical and mental challenge. You will face cravings, withdrawal symptoms, and temptation to smoke again. Preparation can help you as you go through these challenges.  You can cope with cravings by keeping your mouth busy (such as by chewing gum), keeping your body and hands busy, and making calls to family, friends, or a helpline for people who want to quit smoking.  You can cope with withdrawal symptoms by avoiding places where people smoke, avoiding drinks with caffeine, and getting plenty of rest.  Ask your health care provider about the different ways to prevent weight gain, avoid stress, and handle social situations. This information is not  intended to replace advice given to you by your health care provider. Make sure you discuss any questions you have with your health care provider. Document Released: 04/08/2016 Document Revised:  04/08/2016 Document Reviewed: 04/08/2016 Elsevier Interactive Patient Education  2019 [place] Maintenance, Female Adopting a healthy lifestyle and getting preventive care can go a long way to promote health and wellness. Talk with your health care provider about what schedule of regular examinations is right for you. This is a good chance for you to check in with your provider about disease prevention and staying healthy. In between checkups, there are plenty of things you can do on your own. Experts have done a lot of research about which lifestyle changes and preventive measures are most likely to keep you healthy. Ask your health care provider for more information. Weight and diet Eat a healthy diet  Be sure to include plenty of vegetables, fruits, low-fat dairy products, and lean protein.  Do not eat a lot of foods high in solid fats, added sugars, or salt.  Get regular exercise. This is one of the most important things you can do for your health. ? Most adults should exercise for at least 150 minutes each week. The exercise should increase your heart rate and make you sweat (moderate-intensity exercise). ? Most adults should also do strengthening exercises at least twice a week. This is in addition to the moderate-intensity exercise. Maintain a healthy weight  Body mass index (BMI) is a measurement that can be used to identify possible weight problems. It estimates body fat based on height and weight. Your health care provider can help determine your BMI and help you achieve or maintain a healthy weight.  For females 39 years of age and older: ? A BMI below 18.5 is considered underweight. ? A BMI of 18.5 to 24.9 is normal. ? A BMI of 25 to 29.9 is considered overweight. ? A BMI of 30 and above is considered obese. Watch levels of cholesterol and blood lipids  You should start having your blood tested for lipids and cholesterol at 30 years of age, then have this test every  5 years.  You may need to have your cholesterol levels checked more often if: ? Your lipid or cholesterol levels are high. ? You are older than 30 years of age. ? You are at high risk for heart disease. Cancer screening Lung Cancer  Lung cancer screening is recommended for adults 54-56 years old who are at high risk for lung cancer because of a history of smoking.  A yearly low-dose CT scan of the lungs is recommended for people who: ? Currently smoke. ? Have quit within the past 15 years. ? Have at least a 30-pack-year history of smoking. A pack year is smoking an average of one pack of cigarettes a day for 1 year.  Yearly screening should continue until it has been 15 years since you quit.  Yearly screening should stop if you develop a health problem that would prevent you from having lung cancer treatment. Breast Cancer  Practice breast self-awareness. This means understanding how your breasts normally appear and feel.  It also means doing regular breast self-exams. Let your health care provider know about any changes, no matter how small.  If you are in your 20s or 30s, you should have a clinical breast exam (CBE) by a health care provider every 1-3 years as part of a regular health exam.  If you are 40 or older, have  a CBE every year. Also consider having a breast X-ray (mammogram) every year.  If you have a family history of breast cancer, talk to your health care provider about genetic screening.  If you are at high risk for breast cancer, talk to your health care provider about having an MRI and a mammogram every year.  Breast cancer gene (BRCA) assessment is recommended for women who have family members with BRCA-related cancers. BRCA-related cancers include: ? Breast. ? Ovarian. ? Tubal. ? Peritoneal cancers.  Results of the assessment will determine the need for genetic counseling and BRCA1 and BRCA2 testing. Cervical Cancer Your health care provider may recommend  that you be screened regularly for cancer of the pelvic organs (ovaries, uterus, and vagina). This screening involves a pelvic examination, including checking for microscopic changes to the surface of your cervix (Pap test). You may be encouraged to have this screening done every 3 years, beginning at age 76.  For women ages 20-65, health care providers may recommend pelvic exams and Pap testing every 3 years, or they may recommend the Pap and pelvic exam, combined with testing for human papilloma virus (HPV), every 5 years. Some types of HPV increase your risk of cervical cancer. Testing for HPV may also be done on women of any age with unclear Pap test results.  Other health care providers may not recommend any screening for nonpregnant women who are considered low risk for pelvic cancer and who do not have symptoms. Ask your health care provider if a screening pelvic exam is right for you.  If you have had past treatment for cervical cancer or a condition that could lead to cancer, you need Pap tests and screening for cancer for at least 20 years after your treatment. If Pap tests have been discontinued, your risk factors (such as having a new sexual partner) need to be reassessed to determine if screening should resume. Some women have medical problems that increase the chance of getting cervical cancer. In these cases, your health care provider may recommend more frequent screening and Pap tests. Colorectal Cancer  This type of cancer can be detected and often prevented.  Routine colorectal cancer screening usually begins at 30 years of age and continues through 30 years of age.  Your health care provider may recommend screening at an earlier age if you have risk factors for colon cancer.  Your health care provider may also recommend using home test kits to check for hidden blood in the stool.  A small camera at the end of a tube can be used to examine your colon directly (sigmoidoscopy or  colonoscopy). This is done to check for the earliest forms of colorectal cancer.  Routine screening usually begins at age 69.  Direct examination of the colon should be repeated every 5-10 years through 30 years of age. However, you may need to be screened more often if early forms of precancerous polyps or small growths are found. Skin Cancer  Check your skin from head to toe regularly.  Tell your health care provider about any new moles or changes in moles, especially if there is a change in a mole's shape or color.  Also tell your health care provider if you have a mole that is larger than the size of a pencil eraser.  Always use sunscreen. Apply sunscreen liberally and repeatedly throughout the day.  Protect yourself by wearing long sleeves, pants, a wide-brimmed hat, and sunglasses whenever you are outside. Heart disease, diabetes, and high  blood pressure  High blood pressure causes heart disease and increases the risk of stroke. High blood pressure is more likely to develop in: ? People who have blood pressure in the high end of the normal range (130-139/85-89 mm Hg). ? People who are overweight or obese. ? People who are African American.  If you are 73-58 years of age, have your blood pressure checked every 3-5 years. If you are 30 years of age or older, have your blood pressure checked every year. You should have your blood pressure measured twice-once when you are at a hospital or clinic, and once when you are not at a hospital or clinic. Record the average of the two measurements. To check your blood pressure when you are not at a hospital or clinic, you can use: ? An automated blood pressure machine at a pharmacy. ? A home blood pressure monitor.  If you are between 47 years and 87 years old, ask your health care provider if you should take aspirin to prevent strokes.  Have regular diabetes screenings. This involves taking a blood sample to check your fasting blood sugar  level. ? If you are at a normal weight and have a low risk for diabetes, have this test once every three years after 30 years of age. ? If you are overweight and have a high risk for diabetes, consider being tested at a younger age or more often. Preventing infection Hepatitis B  If you have a higher risk for hepatitis B, you should be screened for this virus. You are considered at high risk for hepatitis B if: ? You were born in a country where hepatitis B is common. Ask your health care provider which countries are considered high risk. ? Your parents were born in a high-risk country, and you have not been immunized against hepatitis B (hepatitis B vaccine). ? You have HIV or AIDS. ? You use needles to inject street drugs. ? You live with someone who has hepatitis B. ? You have had sex with someone who has hepatitis B. ? You get hemodialysis treatment. ? You take certain medicines for conditions, including cancer, organ transplantation, and autoimmune conditions. Hepatitis C  Blood testing is recommended for: ? Everyone born from [birth year] through 1967. ? Anyone with known risk factors for hepatitis C. Sexually transmitted infections (STIs)  You should be screened for sexually transmitted infections (STIs) including gonorrhea and chlamydia if: ? You are sexually active and are younger than 30 years of age. ? You are older than 30 years of age and your health care provider tells you that you are at risk for this type of infection. ? Your sexual activity has changed since you were last screened and you are at an increased risk for chlamydia or gonorrhea. Ask your health care provider if you are at risk.  If you do not have HIV, but are at risk, it may be recommended that you take a prescription medicine daily to prevent HIV infection. This is called pre-exposure prophylaxis (PrEP). You are considered at risk if: ? You are sexually active and do not regularly use condoms or know the HIV status  of your partner(s). ? You take drugs by injection. ? You are sexually active with a partner who has HIV. Talk with your health care provider about whether you are at high risk of being infected with HIV. If you choose to begin PrEP, you should first be tested for HIV. You should then be tested every 3 months  for as long as you are taking PrEP. Pregnancy  If you are premenopausal and you may become pregnant, ask your health care provider about preconception counseling.  If you may become pregnant, take 400 to 800 micrograms (mcg) of folic acid every day.  If you want to prevent pregnancy, talk to your health care provider about birth control (contraception). Osteoporosis and menopause  Osteoporosis is a disease in which the bones lose minerals and strength with aging. This can result in serious bone fractures. Your risk for osteoporosis can be identified using a bone density scan.  If you are 59 years of age or older, or if you are at risk for osteoporosis and fractures, ask your health care provider if you should be screened.  Ask your health care provider whether you should take a calcium or vitamin D supplement to lower your risk for osteoporosis.  Menopause may have certain physical symptoms and risks.  Hormone replacement therapy may reduce some of these symptoms and risks. Talk to your health care provider about whether hormone replacement therapy is right for you. Follow these instructions at home:  Schedule regular health, dental, and eye exams.  Stay current with your immunizations.  Do not use any tobacco products including cigarettes, chewing tobacco, or electronic cigarettes.  If you are pregnant, do not drink alcohol.  If you are breastfeeding, limit how much and how often you drink alcohol.  Limit alcohol intake to no more than 1 drink per day for nonpregnant women. One drink equals 12 ounces of beer, 5 ounces of wine, or 1 ounces of hard liquor.  Do not use street  drugs.  Do not share needles.  Ask your health care provider for help if you need support or information about quitting drugs.  Tell your health care provider if you often feel depressed.  Tell your health care provider if you have ever been abused or do not feel safe at home. This information is not intended to replace advice given to you by your health care provider. Make sure you discuss any questions you have with your health care provider. Document Released: 10/25/2010 Document Revised: 09/17/2015 Document Reviewed: 01/13/2015 Elsevier Interactive Patient Education  2019 [employer].

## 2018-06-07 ENCOUNTER — Encounter: Payer: Self-pay | Admitting: Family Medicine

## 2018-06-25 ENCOUNTER — Encounter: Payer: Self-pay | Admitting: Family Medicine

## 2018-06-25 ENCOUNTER — Ambulatory Visit (INDEPENDENT_AMBULATORY_CARE_PROVIDER_SITE_OTHER): Payer: 59 | Admitting: Family Medicine

## 2018-06-25 VITALS — BP 118/68 | HR 88 | Temp 98.4°F | Resp 16 | Ht 62.0 in | Wt 299.0 lb

## 2018-06-25 DIAGNOSIS — B373 Candidiasis of vulva and vagina: Secondary | ICD-10-CM

## 2018-06-25 DIAGNOSIS — N760 Acute vaginitis: Secondary | ICD-10-CM | POA: Diagnosis not present

## 2018-06-25 DIAGNOSIS — B3731 Acute candidiasis of vulva and vagina: Secondary | ICD-10-CM

## 2018-06-25 DIAGNOSIS — B9689 Other specified bacterial agents as the cause of diseases classified elsewhere: Secondary | ICD-10-CM | POA: Diagnosis not present

## 2018-06-25 DIAGNOSIS — N898 Other specified noninflammatory disorders of vagina: Secondary | ICD-10-CM

## 2018-06-25 NOTE — Progress Notes (Signed)
   Subjective:    Patient ID: Valerie Mccarthy, female    DOB: 09-Jun-1990, 28 y.o.   MRN: [id number] This is a 28 yo female being seen in the office today for vaginal itching started days 3 ago 06/21/18 after switching condom brands. Feeling very itchy with white, milky discharge, no dysuria, no odor, no pain with intercourse. She did recently switch detergents.   HPI  Past Medical History:  Diagnosis Date  . Depression    No past surgical history on file. Family History  Problem Relation Age of Onset  . Obesity Mother   . Diabetes Mother   . Arthritis Mother   . Lupus Mother   . Heart disease Father    Social History   Tobacco Use  . Smoking status: Light Tobacco Smoker  . Smokeless tobacco: Never Used  Substance Use Topics  . Alcohol use: Yes    Comment: 2-3 weekly   . Drug use: Never      Review of Systems    Per HPI Objective:   Physical Exam Genitourinary:    General: Normal vulva.     Vagina: No vaginal discharge.    BP 118/68   Pulse 88   Temp 98.4 F (36.9 C) (Oral)   Resp 16   Ht 5\' 2"  (1.575 m)   Wt 299 lb (135.6 kg)   LMP 05/28/2018   SpO2 97%   BMI 54.69 kg/m  BP Readings from Last 3 Encounters:  06/25/18 118/68  06/06/18 118/72  11/10/17 118/72   Filed Weights   06/25/18 0838  Weight: 299 lb (135.6 kg)    LMP 05/28/2018        Assessment & Plan:  Vaginal itching - Plan: WET PREP BY MOLECULAR PROBE  Vaginal discharge - Plan: WET PREP BY MOLECULAR PROBE  Patient Instructions  Good to see you today  I think you have a chemical irritation but will send off a swab to check for infection- I will notify you of results when I get them  Continue to use mild cleanser, keep dry and wear loose fitting clothes

## 2018-06-25 NOTE — Patient Instructions (Addendum)
Good to see you today  I think you have a chemical irritation but will send off a swab to check for infection- I will notify you of results when I get them  Continue to use mild cleanser, keep dry and wear loose fitting clothes

## 2018-06-25 NOTE — Progress Notes (Signed)
   Subjective:    Patient ID: Valerie Mccarthy, female    DOB: Dec 25, 1990, 28 y.o.   MRN: [id number]  HPI This is a 28 yo female who presents today with 3 days of vaginal itching after switching condom brands. Milky discharge, no odor. Feels irritated on outside. No dysuria. Feels better today. Uses Dial for sensitive skin for bathing.   Past Medical History:  Diagnosis Date  . Depression    No past surgical history on file. Family History  Problem Relation Age of Onset  . Obesity Mother   . Diabetes Mother   . Arthritis Mother   . Lupus Mother   . Heart disease Father    Social History   Tobacco Use  . Smoking status: Light Tobacco Smoker  . Smokeless tobacco: Never Used  Substance Use Topics  . Alcohol use: Yes    Comment: 2-3 weekly   . Drug use: Never      Review of Systems Per HPI    Objective:   Physical Exam Constitutional:      General: She is not in acute distress.    Appearance: She is obese. She is not ill-appearing or toxic-appearing.  HENT:     Head: Normocephalic and atraumatic.  Eyes:     Conjunctiva/sclera: Conjunctivae normal.  Cardiovascular:     Rate and Rhythm: Normal rate.  Pulmonary:     Effort: Pulmonary effort is normal.  Genitourinary:    General: Normal vulva.     Vagina: No vaginal discharge.     Comments: Wet prep swab obtained.  Neurological:     Mental Status: She is alert and oriented to person, place, and time.  Psychiatric:        Mood and Affect: Mood normal.        Behavior: Behavior normal.        Thought Content: Thought content normal.        Judgment: Judgment normal.       BP 118/68   Pulse 88   Temp 98.4 F (36.9 C) (Oral)   Resp 16   Ht 5\' 2"  (1.575 m)   Wt 299 lb (135.6 kg)   LMP 05/28/2018   SpO2 97%   BMI 54.69 kg/m  Wt Readings from Last 3 Encounters:  06/25/18 299 lb (135.6 kg)  06/06/18 298 lb 1.6 oz (135.2 kg)  11/10/17 (!) 314 lb 12 oz (142.8 kg)       Assessment & Plan:  1. Vaginal  itching - suspect chemical irritation, will check wet prep - continue use of mild soap, keep genitals dry, loose fitting clothing - WET PREP BY MOLECULAR PROBE  2. Vaginal discharge - WET PREP BY MOLECULAR PROBE   Olean Ree, FNP-BC  [facility] at [facility], [facility]  06/25/2018 8:57 AM

## 2018-06-26 LAB — WET PREP BY MOLECULAR PROBE
Candida species: DETECTED — AB
MICRO NUMBER:: 263585
SPECIMEN QUALITY: ADEQUATE
TRICHOMONAS VAG: NOT DETECTED

## 2018-06-26 MED ORDER — METRONIDAZOLE 0.75 % VA GEL: 1.0000 | Freq: Every day | VAGINAL | 0 refills | Status: DC

## 2018-06-26 MED ORDER — FLUCONAZOLE 150 MG PO TABS: 150.0000 mg | ORAL_TABLET | Freq: Once | ORAL | 0 refills | Status: AC

## 2018-06-26 NOTE — Addendum Note (Signed)
Addended by: Olean Ree B on: 06/26/2018 04:53 PM   Modules accepted: Orders

## 2018-07-14 ENCOUNTER — Telehealth: Payer: 59 | Admitting: Family

## 2018-07-14 DIAGNOSIS — M545 Low back pain, unspecified: Secondary | ICD-10-CM

## 2018-07-14 MED ORDER — BACLOFEN 10 MG PO TABS: 10.0000 mg | ORAL_TABLET | Freq: Three times a day (TID) | ORAL | 0 refills | Status: DC | PRN

## 2018-07-14 NOTE — Progress Notes (Signed)
Greater than 5 minutes, yet less than 10 minutes of time have been spent researching, coordinating, and implementing care for this patient today.  Thank you for the details you included in the comment boxes. Those details are very helpful in determining the best course of treatment for you and help [place] to provide the best care.  We are sorry that you are not feeling well.  Here is how we plan to help!  Based on what you have shared with me it looks like you mostly have acute back pain.  Acute back pain is defined as musculoskeletal pain that can resolve in 1-3 weeks with conservative treatment.  I have prescribed Naprosyn 500 mg twice a day non-steroid anti-inflammatory (NSAID) (over the counter)  as well as Baclofen 10 mg every eight hours as needed which is a muscle relaxer  Some patients experience stomach irritation or in increased heartburn with anti-inflammatory drugs.  Please keep in mind that muscle relaxer's can cause fatigue and should not be taken while at work or driving.  Back pain is very common.  The pain often gets better over time.  The cause of back pain is usually not dangerous.  Most people can learn to manage their back pain on their own.  Home Care  Stay active.  Start with short walks on flat ground if you can.  Try to walk farther each day.  Do not sit, drive or stand in one place for more than 30 minutes.  Do not stay in bed.  Do not avoid exercise or work.  Activity can help your back heal faster.  Be careful when you bend or lift an object.  Bend at your knees, keep the object close to you, and do not twist.  Sleep on a firm mattress.  Lie on your side, and bend your knees.  If you lie on your back, put a pillow under your knees.  Only take medicines as told by your doctor.  Put ice on the injured area.  Put ice in a plastic bag  Place a towel between your skin and the bag  Leave the ice on for 15-20 minutes, 3-4 times a day for the first 2-3 days. 210 After  that, you can switch between ice and heat packs.  Ask your doctor about back exercises or massage.  Avoid feeling anxious or stressed.  Find good ways to deal with stress, such as exercise.  Get Help Right Way If:  Your pain does not go away with rest or medicine.  Your pain does not go away in 1 week.  You have new problems.  You do not feel well.  The pain spreads into your legs.  You cannot control when you poop (bowel movement) or pee (urinate)  You feel sick to your stomach (nauseous) or throw up (vomit)  You have belly (abdominal) pain.  You feel like you may pass out (faint).  If you develop a fever.  Make Sure you:  Understand these instructions.  Will watch your condition  Will get help right away if you are not doing well or get worse.  Your e-visit answers were reviewed by a board certified advanced clinical practitioner to complete your personal care plan.  Depending on the condition, your plan could have included both over the counter or prescription medications.  If there is a problem please reply  once you have received a response from your provider.  Your safety is important to [place].  If you have drug  allergies check your prescription carefully.    You can use MyChart to ask questions about today's visit, request a non-urgent call back, or ask for a work or school excuse for 24 hours related to this e-Visit. If it has been greater than 24 hours you will need to follow up with your provider, or enter a new e-Visit to address those concerns.  You will get an e-mail in the next two days asking about your experience.  I hope that your e-visit has been valuable and will speed your recovery. Thank you for using e-visits.

## 2018-07-16 ENCOUNTER — Telehealth: Payer: Self-pay | Admitting: *Deleted

## 2018-07-16 NOTE — Telephone Encounter (Signed)
Pt called she stated that she was only taken the baclofen, she was informed if the baclofen was making her sleepy to take it at night, pt stated that she had not used the heat or ice yet, pt was encouraged it alternate heat and ice no longer than 20 mins at a time, pt also encouraged to start doing gental stretches to help with the pain, pt verbalized understanding agreed with the plan and told that if this plan didn't help to give [place] a call back.

## 2018-07-16 NOTE — Telephone Encounter (Signed)
Noted  

## 2018-07-16 NOTE — Telephone Encounter (Signed)
Please call patient and find out if she is taking naprosyn as well as baclofen? If baclofen is making her sleepy, she should only take it at night. Has she tried heat/ice, gentle stretching?

## 2018-07-16 NOTE — Telephone Encounter (Signed)
Patient left a voicemail stating that she did an e-visit Friday and was given Baclofen for her side and back pain. Patient stated that the medication is making her sleepy, but no really helping with the pain.  Patient wants to know what Deboraha Sprang NP would recommend?

## 2018-07-17 DIAGNOSIS — M545 Low back pain: Secondary | ICD-10-CM | POA: Diagnosis not present

## 2018-07-17 NOTE — Telephone Encounter (Signed)
Noted  

## 2018-07-17 NOTE — Telephone Encounter (Addendum)
Alternating heat and ice is not helping,pt is taking the baclofen; pt is not taking naprosyn; new symptom of rt side pain that is sharp, pain is there all the time and when pt moves it intensifies the pain; pt cannot stand up straight due to the rt sided pain. Pt still has appendix. No fever. But doesn ot have thermometer. Pt said if gently pushes in on rt side intensifies the pain.no urinary symptoms except the urine sounds different but pt cannot explain what that means. Pt is in a lot of pain at this time; pain level 9. No diarrhea or constipation. Harlin Heys FNP out of office. Right  Side pain started on 07/14/18  And gradually worsened. I spoke with Dr Reece Agar who said pt needed to be evaluated; when I spoke back with pt the pain felt worse and pt might need imaging or labs. Pt denied needing 911 and will have her mom take her to ED now. FYI to Harlin Heys FNP and Dr Reece Agar.

## 2018-07-18 NOTE — Telephone Encounter (Signed)
Pt called she went to urgent care they gave her a shot and a script for Toradol she stated that she felt some better, pt informed that if she needs [place] to please call us

## 2018-07-18 NOTE — Telephone Encounter (Signed)
Please call patient and see if she went to ED? If not, how is she feeling?

## 2018-07-18 NOTE — Telephone Encounter (Signed)
Noted  

## 2018-10-17 ENCOUNTER — Ambulatory Visit: Payer: 59 | Admitting: Family Medicine

## 2018-10-22 ENCOUNTER — Other Ambulatory Visit: Payer: Self-pay

## 2018-10-22 ENCOUNTER — Ambulatory Visit (INDEPENDENT_AMBULATORY_CARE_PROVIDER_SITE_OTHER): Payer: 59 | Admitting: Family Medicine

## 2018-10-22 ENCOUNTER — Encounter: Payer: Self-pay | Admitting: Family Medicine

## 2018-10-22 VITALS — BP 120/76 | HR 86 | Ht 62.0 in | Wt 296.5 lb

## 2018-10-22 DIAGNOSIS — F419 Anxiety disorder, unspecified: Secondary | ICD-10-CM

## 2018-10-22 DIAGNOSIS — N920 Excessive and frequent menstruation with regular cycle: Secondary | ICD-10-CM | POA: Diagnosis not present

## 2018-10-22 DIAGNOSIS — F32A Depression, unspecified: Secondary | ICD-10-CM

## 2018-10-22 DIAGNOSIS — F329 Major depressive disorder, single episode, unspecified: Secondary | ICD-10-CM | POA: Diagnosis not present

## 2018-10-22 MED ORDER — ESCITALOPRAM OXALATE 10 MG PO TABS: 10.0000 mg | ORAL_TABLET | Freq: Every day | ORAL | 1 refills | Status: DC

## 2018-10-22 NOTE — Progress Notes (Signed)
Subjective:    Patient ID: Valerie Mccarthy, female    DOB: 07/05/1990, 28 y.o.   MRN: [id number]  HPI  This is a 28 year old female who presents today to discuss worsening depression and anxiety.  She has struggled with depression for many years and has recently started seeing a therapist.  She reports that since coronavirus pandemic everything seems to be going wrong and she is having increased crying, irritability, lack of motivation.  She has never been on medication but her therapist suggested that she see her healthcare provider and be screened.  She and her mother have not been getting along recently; her mother always "puts me down."  1 of her uncles died recently from the coronavirus.  She has had a cut in her pain due to decreased work hours and she has been having multiple troubles with her car so she is under a lot of financial stress.  She is in a relationship and reports that her boyfriend is very supportive of her.  Her periods are regular and very heavy for the first 2 days.  She has a lot of cramps.  She feels very fatigued immediately following her cycle.  Past Medical History:  Diagnosis Date  . Depression    History reviewed. No pertinent surgical history. Family History  Problem Relation Age of Onset  . Obesity Mother   . Diabetes Mother   . Arthritis Mother   . Lupus Mother   . Heart disease Father    Social History   Tobacco Use  . Smoking status: Light Tobacco Smoker  . Smokeless tobacco: Never Used  Substance Use Topics  . Alcohol use: Yes    Comment: 2-3 weekly   . Drug use: Never     Review of Systems Per HPI    Objective:   Physical Exam Vitals signs reviewed.  Constitutional:      General: She is not in acute distress.    Appearance: She is obese. She is not ill-appearing, toxic-appearing or diaphoretic.  Eyes:     Conjunctiva/sclera: Conjunctivae normal.  Cardiovascular:     Rate and Rhythm: Normal rate.  Pulmonary:     Effort: Pulmonary  effort is normal.  Neurological:     Mental Status: She is alert and oriented to person, place, and time.  Psychiatric:        Mood and Affect: Mood normal.        Behavior: Behavior normal.        Thought Content: Thought content normal.        Judgment: Judgment normal.       BP 120/76   Pulse 86   Ht 5\' 2"  (1.575 m)   Wt 296 lb 8 oz (134.5 kg)   SpO2 97%   BMI 54.23 kg/m  Wt Readings from Last 3 Encounters:  10/22/18 296 lb 8 oz (134.5 kg)  06/25/18 299 lb (135.6 kg)  06/06/18 298 lb 1.6 oz (135.2 kg)   Depression screen [facility] 2/9 10/22/2018 06/06/2018 11/10/2017  Decreased Interest 1 0 0  Down, Depressed, Hopeless 1 1 0  PHQ - 2 Score 2 1 0  Altered sleeping 1 - -  Tired, decreased energy 1 - -  Change in appetite 1 - -  Feeling bad or failure about yourself  1 - -  Trouble concentrating 0 - -  Moving slowly or fidgety/restless 0 - -  Suicidal thoughts 0 - -  PHQ-9 Score 6 - -   GAD 7 : Generalized  Anxiety Score 10/22/2018  Nervous, Anxious, on Edge 1  Control/stop worrying 1  Worry too much - different things 1  Trouble relaxing 1  Restless 1  Easily annoyed or irritable 1  Afraid - awful might happen 1  Total GAD 7 Score 7        Assessment & Plan:  1. Anxiety and depression -Encouraged her to continue therapy and discussed other nonpharmacologic ways to improve her mood including healthy food choices, regular walking, adequate sleep - escitalopram (LEXAPRO) 10 MG tablet; Take 1 tablet (10 mg total) by mouth at bedtime.  Dispense: 90 tablet; Refill: 1  2. Menorrhagia with regular cycle - TSH - CBC with Differential - Ferritin  3. Morbid obesity (HCC) - TSH   Valerie Reeeborah Niyla Marone, FNP-BC  [facility], [facility]  10/22/2018 5:23 PM

## 2018-10-22 NOTE — Patient Instructions (Signed)
Good to see you today  Please schedule a follow up visit for 2 months. Be in touch sooner if you have problems with medication or your symptoms get worse.   How to help anxiety and depression   1) Walk daily- build up to 30 minutes every day  2)  Begin a Mindfulness/Meditation practice -- this can take a little as 3 minutes and is helpful for all kinds of mood issues -- You can find resources in books -- Or you can download apps like  ---- Headspace App (which currently has free content called "Weathering the Storm") ---- Calm (which has a few free options)  ---- Insignt Timer ---- Stop, Breathe & Think  # With each of these Apps - you should decline the "start free trial" offer and as you search through the App should be able to access some of their free content. You can also chose to pay for the content if you find one that works well for you.   # Many of them also offer sleep specific content which may help with insomnia  3) Healthy Diet -- Eat mostly real foods- lean meats, fish, eggs, low fat dairy, vegetables, fruits and whole grains -- Avoid or decrease Caffeine -- Avoid or decrease Alcohol -- Drink plenty of water, have a balanced diet -- Avoid cigarettes and marijuana (as well as other recreational drugs)

## 2018-10-23 LAB — CBC WITH DIFFERENTIAL/PLATELET
Basophils Absolute: 0.1 10*3/uL (ref 0.0–0.1)
Basophils Relative: 0.6 % (ref 0.0–3.0)
Eosinophils Absolute: 0.1 10*3/uL (ref 0.0–0.7)
Eosinophils Relative: 1.2 % (ref 0.0–5.0)
HCT: 38.9 % (ref 36.0–46.0)
Hemoglobin: 12.5 g/dL (ref 12.0–15.0)
Lymphocytes Relative: 18.6 % (ref 12.0–46.0)
Lymphs Abs: 1.7 10*3/uL (ref 0.7–4.0)
MCHC: 32.3 g/dL (ref 30.0–36.0)
MCV: 79.2 fl (ref 78.0–100.0)
Monocytes Absolute: 0.6 10*3/uL (ref 0.1–1.0)
Monocytes Relative: 6.3 % (ref 3.0–12.0)
Neutro Abs: 6.6 10*3/uL (ref 1.4–7.7)
Neutrophils Relative %: 73.3 % (ref 43.0–77.0)
Platelets: 308 10*3/uL (ref 150.0–400.0)
RBC: 4.91 Mil/uL (ref 3.87–5.11)
RDW: 16.2 % — ABNORMAL HIGH (ref 11.5–15.5)
WBC: 9 10*3/uL (ref 4.0–10.5)

## 2018-10-23 LAB — FERRITIN: Ferritin: 14.6 ng/mL (ref 10.0–291.0)

## 2018-10-23 LAB — TSH: TSH: 1.8 u[IU]/mL (ref 0.35–4.50)

## 2018-12-05 ENCOUNTER — Ambulatory Visit: Payer: Commercial Managed Care - PPO | Admitting: Family Medicine

## 2018-12-17 ENCOUNTER — Ambulatory Visit: Payer: 59 | Admitting: Family Medicine

## 2019-04-23 ENCOUNTER — Ambulatory Visit: Payer: 59 | Attending: Internal Medicine

## 2019-04-23 DIAGNOSIS — Z20822 Contact with and (suspected) exposure to covid-19: Secondary | ICD-10-CM

## 2019-04-23 DIAGNOSIS — Z20828 Contact with and (suspected) exposure to other viral communicable diseases: Secondary | ICD-10-CM | POA: Diagnosis not present

## 2019-04-25 LAB — NOVEL CORONAVIRUS, NAA: SARS-CoV-2, NAA: NOT DETECTED

## 2019-07-01 ENCOUNTER — Telehealth: Payer: Self-pay | Admitting: Family

## 2019-07-01 ENCOUNTER — Ambulatory Visit: Payer: BC Managed Care – PPO | Attending: Internal Medicine

## 2019-07-01 DIAGNOSIS — Z20822 Contact with and (suspected) exposure to covid-19: Secondary | ICD-10-CM

## 2019-07-01 MED ORDER — BENZONATATE 100 MG PO CAPS: 100.0000 mg | ORAL_CAPSULE | Freq: Three times a day (TID) | ORAL | 0 refills | Status: DC | PRN

## 2019-07-01 NOTE — Progress Notes (Signed)
E-Visit for Corona Virus Screening  Your current symptoms could be consistent with the coronavirus.  Many health care providers can now test patients at their office but not all are.  [place] has multiple testing sites. For information on our COVID testing locations and hours go to [email or web address]  We are enrolling you in our MyChart Home Monitoring for COVID19 . Daily you will receive a questionnaire within the MyChart website. Our COVID 19 response team will be monitoring your responses daily.  Testing Information: The COVID-19 Community Testing sites will begin testing BY APPOINTMENT ONLY.  You can schedule online at [email or web address]  If you do not have access to a smart phone or computer you may call [phone] for an appointment.   Additional testing sites in the Community:  . For [facility] Testing sites in [place]  https://www.cvs.com/minuteclinic/covid-19-testing  . For Pop-up testing sites in [place]  https://covid19.ncdhhs.gov/about-covid-19/testing/find-my-testing-place/pop-testing-sites  . For Testing sites with regular hours https://onsms.org/[email or web address]  . For [place] https://[email or web address]/covid-19-community-outreach-testing/  . For [facility] https://[place].gov[place]/human-services/health-department/coronavirus-covid-19-info/covid-19-testing  . For [place] testing in  and [place] https://www.[place].gov[place]/human-services/health-department/coronavirus-covid-19-info/covid-19-testing  . For [facility] testing in [place]   https://lhi.care/covidtesting  For  more information about community testing call [phone]   Please quarantine yourself while awaiting your test results. Please stay home for a minimum of 10 days from the first day of illness with improving symptoms and you have had 24 hours of no fever (without the use of Tylenol (Acetaminophen)  Motrin (Ibuprofen) or any fever reducing medication).  Also - Do not get tested prior to returning to work because once you have had a positive test the test can stay positive for more then a month in some cases.   You should wear a mask or cloth face covering over your nose and mouth if you must be around other people or animals, including pets (even at home). Try to stay at least 6 feet away from other people. This will protect the people around you.  Please continue good preventive care measures, including:  frequent hand-washing, avoid touching your face, cover coughs/sneezes, stay out of crowds and keep a 6 foot distance from others.  COVID-19 is a respiratory illness with symptoms that are similar to the flu. Symptoms are typically mild to moderate, but there have been cases of severe illness and death due to the virus.   The following symptoms may appear 2-14 days after exposure: . Fever . Cough . Shortness of breath or difficulty breathing . Chills . Repeated shaking with chills . Muscle pain . Headache . Sore throat . New loss of taste or smell . Fatigue . Congestion or runny nose . Nausea or vomiting . Diarrhea  Go to the nearest hospital ED for assessment if fever/cough/breathlessness are severe or illness seems like a threat to life.  It is vitally important that if you feel that you have an infection such as this virus or any other virus that you stay home and away from places where you may spread it to others.  You should avoid contact with people age 65 and older.   You can use medication such as A prescription cough medication called Tessalon Perles 100 mg. You may take 1-2 capsules every 8 hours as needed for cough.  You may also take acetaminophen (Tylenol) as needed for fever.  Reduce your risk of any infection by using the same precautions used for avoiding the common cold or flu:  . Wash your hands   often with soap and warm water for at least 20 seconds.  If soap and  water are not readily available, use an alcohol-based hand sanitizer with at least 60% alcohol.  . If coughing or sneezing, cover your mouth and nose by coughing or sneezing into the elbow areas of your shirt or coat, into a tissue or into your sleeve (not your hands). . Avoid shaking hands with others and consider head nods or verbal greetings only. . Avoid touching your eyes, nose, or mouth with unwashed hands.  . Avoid close contact with people who are sick. . Avoid places or events with large numbers of people in one location, like concerts or sporting events. . Carefully consider travel plans you have or are making. . If you are planning any travel outside or inside the [place], visit the CDC's Travelers' Health webpage for the latest health notices. . If you have some symptoms but not all symptoms, continue to monitor at home and seek medical attention if your symptoms worsen. . If you are having a medical emergency, call 911.  HOME CARE . Only take medications as instructed by your medical team. . Drink plenty of fluids and get plenty of rest. . A steam or ultrasonic humidifier can help if you have congestion.   GET HELP RIGHT AWAY IF YOU HAVE EMERGENCY WARNING SIGNS** FOR COVID-19. If you or someone is showing any of these signs seek emergency medical care immediately. Call 911 or proceed to your closest emergency facility if: . You develop worsening high fever. . Trouble breathing . Bluish lips or face . Persistent pain or pressure in the chest . New confusion . Inability to wake or stay awake . You cough up blood. . Your symptoms become more severe  **This list is not all possible symptoms. Contact your medical provider for any symptoms that are sever or concerning to you.  MAKE SURE YOU   Understand these instructions.  Will watch your condition.  Will get help right away if you are not doing well or get worse.  Your e-visit answers were reviewed by a board certified  advanced clinical practitioner to complete your personal care plan.  Depending on the condition, your plan could have included both over the counter or prescription medications.  If there is a problem please reply once you have received a response from your provider.  Your safety is important to us.  If you have drug allergies check your prescription carefully.    You can use MyChart to ask questions about today's visit, request a non-urgent call back, or ask for a work or school excuse for 24 hours related to this e-Visit. If it has been greater than 24 hours you will need to follow up with your provider, or enter a new e-Visit to address those concerns. You will get an e-mail in the next two days asking about your experience.  I hope that your e-visit has been valuable and will speed your recovery. Thank you for using e-visits.  Approximately 5 minutes was spent documenting and reviewing patient's chart.    

## 2019-07-02 LAB — NOVEL CORONAVIRUS, NAA: SARS-CoV-2, NAA: NOT DETECTED

## 2019-07-03 ENCOUNTER — Encounter (INDEPENDENT_AMBULATORY_CARE_PROVIDER_SITE_OTHER): Payer: Self-pay

## 2019-07-18 ENCOUNTER — Ambulatory Visit: Payer: BC Managed Care – PPO | Attending: Internal Medicine

## 2019-07-18 DIAGNOSIS — Z20822 Contact with and (suspected) exposure to covid-19: Secondary | ICD-10-CM | POA: Diagnosis not present

## 2019-07-19 LAB — NOVEL CORONAVIRUS, NAA: SARS-CoV-2, NAA: NOT DETECTED

## 2019-07-19 LAB — SARS-COV-2, NAA 2 DAY TAT

## 2019-08-26 ENCOUNTER — Telehealth: Payer: Self-pay | Admitting: Physician Assistant

## 2019-08-26 DIAGNOSIS — R509 Fever, unspecified: Secondary | ICD-10-CM

## 2019-08-26 DIAGNOSIS — R52 Pain, unspecified: Secondary | ICD-10-CM

## 2019-08-26 NOTE — Progress Notes (Signed)
We are sorry you are not feeling well.  Here is how we plan to help!  Based on what you have shared with me, it looks like you may have a a delayed response to the COVID-19 vaccine or an viral upper respiratory infection.  If the symptoms are from the vaccine you should feel better in several days, but if you have a URI you may have symptoms for a full week.  I have written a work note for several days.    Upper respiratory infections are caused by a large number of viruses; however, rhinovirus is the most common cause. Symptoms vary from person to person, with common symptoms including sore throat, cough, fatigue or lack of energy and feeling of general discomfort.  A low-grade fever of up to 100.4 may present, but is often uncommon.  Symptoms vary however, and are closely related to a person's age or underlying illnesses.  The most common symptoms associated with an upper respiratory infection are nasal discharge or congestion, cough, sneezing, headache and pressure in the ears and face.  These symptoms usually persist for about 3 to 10 days, but can last up to 2 weeks.  It is important to know that upper respiratory infections do not cause serious illness or complications in most cases.    Upper respiratory infections can be transmitted from person to person, with the most common method of transmission being a person's hands.  The virus is able to live on the skin and can infect other persons for up to 2 hours after direct contact.  Also, these can be transmitted when someone coughs or sneezes; thus, it is important to cover the mouth to reduce this risk.  To keep the spread of the illness at bay, good hand hygiene is very important.  This is an infection that is most likely caused by a virus. There are no specific treatments other than to help you with the symptoms until the infection runs its course.  We are sorry you are not feeling well.  Here is how we plan to help!   For nasal congestion, you may  use an oral decongestants such as Mucinex D or if you have glaucoma or high blood pressure use plain Mucinex.  Saline nasal spray or nasal drops can help and can safely be used as often as needed for congestion.    If you do not have a history of heart disease, hypertension, diabetes or thyroid disease, prostate/bladder issues or glaucoma, you may also use Sudafed to treat nasal congestion.  It is highly recommended that you consult with a pharmacist or your primary care physician to ensure this medication is safe for you to take.     If you have a cough, you may use cough suppressants such as Delsym and Robitussin.  If you have glaucoma or high blood pressure, you can also use Coricidin HBP.    If you have a sore or scratchy throat, use a saltwater gargle-  to  teaspoon of salt dissolved in a 4-ounce to 8-ounce glass of warm water.  Gargle the solution for approximately 15-30 seconds and then spit.  It is important not to swallow the solution.  You can also use throat lozenges/cough drops and Chloraseptic spray to help with throat pain or discomfort.  Warm or cold liquids can also be helpful in relieving throat pain.  For headache, pain or general discomfort, you can use Ibuprofen or Tylenol as directed.   Some authorities believe that zinc sprays  or the use of Echinacea may shorten the course of your symptoms.   HOME CARE . Only take medications as instructed by your medical team. . Be sure to drink plenty of fluids. Water is fine as well as fruit juices, sodas and electrolyte beverages. You may want to stay away from caffeine or alcohol. If you are nauseated, try taking small sips of liquids. How do you know if you are getting enough fluid? Your urine should be a pale yellow or almost colorless. . Get rest. . Taking a steamy shower or using a humidifier may help nasal congestion and ease sore throat pain. You can place a towel over your head and breathe in the steam from hot water coming from a  faucet. . Using a saline nasal spray works much the same way. . Cough drops, hard candies and sore throat lozenges may ease your cough. . Avoid close contacts especially the very young and the elderly . Cover your mouth if you cough or sneeze . Always remember to wash your hands.   GET HELP RIGHT AWAY IF: . You develop worsening fever. . If your symptoms do not improve within 10 days . You develop yellow or green discharge from your nose over 3 days. . You have coughing fits . You develop a severe head ache or visual changes. . You develop shortness of breath, difficulty breathing or start having chest pain . Your symptoms persist after you have completed your treatment plan  MAKE SURE YOU   Understand these instructions.  Will watch your condition.  Will get help right away if you are not doing well or get worse.  Your e-visit answers were reviewed by a board certified advanced clinical practitioner to complete your personal care plan. Depending upon the condition, your plan could have included both over the counter or prescription medications. Please review your pharmacy choice. If there is a problem, you may call our nursing hot line at and have the prescription routed to another pharmacy. Your safety is important to [place]. If you have drug allergies check your prescription carefully.   You can use MyChart to ask questions about today's visit, request a non-urgent call back, or ask for a work or school excuse for 24 hours related to this e-Visit. If it has been greater than 24 hours you will need to follow up with your provider, or enter a new e-Visit to address those concerns. You will get an e-mail in the next two days asking about your experience.  I hope that your e-visit has been valuable and will speed your recovery. Thank you for using e-visits.     Greater than 5 minutes, yet less than 10 minutes of time have been spent researching, coordinating, and implementing care for  this patient today

## 2019-08-28 ENCOUNTER — Ambulatory Visit: Payer: Self-pay | Admitting: Family Medicine

## 2019-08-28 DIAGNOSIS — Z0289 Encounter for other administrative examinations: Secondary | ICD-10-CM

## 2019-09-01 ENCOUNTER — Telehealth: Payer: Self-pay | Admitting: Family

## 2019-09-01 DIAGNOSIS — J069 Acute upper respiratory infection, unspecified: Secondary | ICD-10-CM

## 2019-09-01 NOTE — Progress Notes (Signed)
We are sorry you are not feeling well.  Here is how we plan to help!  Based on what you have shared with me, it looks like you may have a viral upper respiratory infection.  Upper respiratory infections are caused by a large number of viruses; however, rhinovirus is the most common cause.   Symptoms vary from person to person, with common symptoms including sore throat, cough, fatigue or lack of energy and feeling of general discomfort.  A low-grade fever of up to 100.4 may present, but is often uncommon.  Symptoms vary however, and are closely related to a person's age or underlying illnesses.  The most common symptoms associated with an upper respiratory infection are nasal discharge or congestion, cough, sneezing, headache and pressure in the ears and face.  These symptoms usually persist for about 3 to 10 days, but can last up to 2 weeks.  It is important to know that upper respiratory infections do not cause serious illness or complications in most cases.    Upper respiratory infections can be transmitted from person to person, with the most common method of transmission being a person's hands.  The virus is able to live on the skin and can infect other persons for up to 2 hours after direct contact.  Also, these can be transmitted when someone coughs or sneezes; thus, it is important to cover the mouth to reduce this risk.  To keep the spread of the illness at [place], good hand hygiene is very important.  This is an infection that is most likely caused by a virus. There are no specific treatments other than to help you with the symptoms until the infection runs its course.  We are sorry you are not feeling well.  Here is how we plan to help!   For nasal congestion, you may use an oral decongestants such as Mucinex D or if you have glaucoma or high blood pressure use plain Mucinex.  Saline nasal spray or nasal drops can help and can safely be used as often as needed for congestion.  For your congestion,  I have prescribed Fluticasone nasal spray one spray in each nostril twice a day.  Continue with rest, force and fluids, and Tylenol as needed. The symptoms should result in the next week or so. If not you need to be seen face-to-face.  If you do not have a history of heart disease, hypertension, diabetes or thyroid disease, prostate/bladder issues or glaucoma, you may also use Sudafed to treat nasal congestion.  It is highly recommended that you consult with a pharmacist or your primary care physician to ensure this medication is safe for you to take.     If you have a cough, you may use cough suppressants such as Delsym and Robitussin.  If you have glaucoma or high blood pressure, you can also use Coricidin HBP.    If you have a sore or scratchy throat, use a saltwater gargle-  to  teaspoon of salt dissolved in a 4-ounce to 8-ounce glass of warm water.  Gargle the solution for approximately 15-30 seconds and then spit.  It is important not to swallow the solution.  You can also use throat lozenges/cough drops and Chloraseptic spray to help with throat pain or discomfort.  Warm or cold liquids can also be helpful in relieving throat pain.  For headache, pain or general discomfort, you can use Ibuprofen or Tylenol as directed.   Some authorities believe that zinc sprays or the use  of Echinacea may shorten the course of your symptoms.   HOME CARE . Only take medications as instructed by your medical team. . Be sure to drink plenty of fluids. Water is fine as well as fruit juices, sodas and electrolyte beverages. You may want to stay away from caffeine or alcohol. If you are nauseated, try taking small sips of liquids. How do you know if you are getting enough fluid? Your urine should be a pale yellow or almost colorless. . Get rest. . Taking a steamy shower or using a humidifier may help nasal congestion and ease sore throat pain. You can place a towel over your head and breathe in the steam from  hot water coming from a faucet. . Using a saline nasal spray works much the same way. . Cough drops, hard candies and sore throat lozenges may ease your cough. . Avoid close contacts especially the very young and the elderly . Cover your mouth if you cough or sneeze . Always remember to wash your hands.   GET HELP RIGHT AWAY IF: . You develop worsening fever. . If your symptoms do not improve within 10 days . You develop yellow or green discharge from your nose over 3 days. . You have coughing fits . You develop a severe head ache or visual changes. . You develop shortness of breath, difficulty breathing or start having chest pain . Your symptoms persist after you have completed your treatment plan  MAKE SURE YOU   Understand these instructions.  Will watch your condition.  Will get help right away if you are not doing well or get worse.  Your e-visit answers were reviewed by a board certified advanced clinical practitioner to complete your personal care plan. Depending upon the condition, your plan could have included both over the counter or prescription medications. Please review your pharmacy choice. If there is a problem, you may call our nursing hot line at and have the prescription routed to another pharmacy. Your safety is important to [place]. If you have drug allergies check your prescription carefully.   You can use MyChart to ask questions about today's visit, request a non-urgent call back, or ask for a work or school excuse for 24 hours related to this e-Visit. If it has been greater than 24 hours you will need to follow up with your provider, or enter a new e-Visit to address those concerns. You will get an e-mail in the next two days asking about your experience.  I hope that your e-visit has been valuable and will speed your recovery. Thank you for using e-visits.    Approximately 5 minutes was spent documenting and reviewing patient's chart.

## 2019-09-13 ENCOUNTER — Telehealth: Payer: Self-pay | Admitting: Family Medicine

## 2019-09-13 NOTE — Progress Notes (Signed)
Unable to reach patient for virtual visit. 

## 2019-09-13 NOTE — Progress Notes (Deleted)
Virtual Visit via Video Note  I connected with Valerie Mccarthy on 09/13/19 at  8:30 AM EDT by a video enabled telemedicine application and verified that I am speaking with the correct person using two identifiers.  Location: Patient: *** Provider: ***   I discussed the limitations of evaluation and management by telemedicine and the availability of in person appointments. The patient expressed understanding and agreed to proceed.  History of Present Illness:    Observations/Objective:   Assessment and Plan:   Follow Up Instructions:    I discussed the assessment and treatment plan with the patient. The patient was provided an opportunity to ask questions and all were answered. The patient agreed with the plan and demonstrated an understanding of the instructions.   The patient was advised to call back or seek an in-person evaluation if the symptoms worsen or if the condition fails to improve as anticipated.  I provided *** minutes of non-face-to-face time during this encounter.   Emi Belfast, FNP

## 2019-09-17 DIAGNOSIS — J0100 Acute maxillary sinusitis, unspecified: Secondary | ICD-10-CM | POA: Diagnosis not present

## 2019-09-17 DIAGNOSIS — G43009 Migraine without aura, not intractable, without status migrainosus: Secondary | ICD-10-CM | POA: Diagnosis not present

## 2019-09-17 DIAGNOSIS — Z1152 Encounter for screening for COVID-19: Secondary | ICD-10-CM | POA: Diagnosis not present

## 2019-09-25 ENCOUNTER — Encounter: Payer: Self-pay | Admitting: Family Medicine

## 2019-10-10 ENCOUNTER — Telehealth: Payer: Self-pay

## 2019-10-10 NOTE — Telephone Encounter (Signed)
Spoke with patient regarding her pattern of no shows and that if this occurs again, we will unfortunately have to dismiss her from our practice.  Patient has had 3 no shows within the last year (2 of which within the last month).    Patient verbalizes understanding and agrees to keep appointment tomorrow and knows that we require a 24 hour notification for appt cancellation moving forward.   Patient scheduled for 1215 tomorrow with her PCP Deboraha Sprang, NP.

## 2019-10-10 NOTE — Telephone Encounter (Signed)
[facility] Night - Client Nonclinical Telephone Record AccessNurse Client [facility] Night - Client Client Site  [facility] [place] - Night Physician Deboraha Sprang- NP Contact Type Call Who Is Calling Patient / Member / Family / Caregiver Caller Name Jazzmen Onelia Cadmus Phone Number [phone] Patient Name Valerie Mccarthy Patient DOB 07-09-1990 Call Type Message Only Information Provided Reason for Call Request to Schedule Office Appointment Initial Comment Caller is wanting to make a patient with her Physician to change her medication. Additional Comment Provided office hours and advised caller to call back during normal business hours. Caller declined triage. Disp. Time Disposition Final User 10/09/2019 5:48:56 PM General Information Provided Yes Pense, Lori Call Closed By: Russ Halo Transaction Date/Time: 10/09/2019 5:46:36 PM (ET)

## 2019-10-11 ENCOUNTER — Encounter: Payer: Self-pay | Admitting: Family Medicine

## 2019-10-11 ENCOUNTER — Other Ambulatory Visit: Payer: Self-pay

## 2019-10-11 ENCOUNTER — Ambulatory Visit (INDEPENDENT_AMBULATORY_CARE_PROVIDER_SITE_OTHER): Payer: BC Managed Care – PPO | Admitting: Family Medicine

## 2019-10-11 VITALS — BP 102/60 | HR 86 | Temp 97.6°F | Ht 62.0 in | Wt 324.4 lb

## 2019-10-11 DIAGNOSIS — F419 Anxiety disorder, unspecified: Secondary | ICD-10-CM

## 2019-10-11 DIAGNOSIS — F329 Major depressive disorder, single episode, unspecified: Secondary | ICD-10-CM

## 2019-10-11 DIAGNOSIS — F32A Depression, unspecified: Secondary | ICD-10-CM

## 2019-10-11 LAB — FERRITIN: Ferritin: 32.7 ng/mL (ref 10.0–291.0)

## 2019-10-11 LAB — CBC WITH DIFFERENTIAL/PLATELET
Basophils Absolute: 0.1 10*3/uL (ref 0.0–0.1)
Basophils Relative: 0.6 % (ref 0.0–3.0)
Eosinophils Absolute: 0.1 10*3/uL (ref 0.0–0.7)
Eosinophils Relative: 1.4 % (ref 0.0–5.0)
HCT: 37.3 % (ref 36.0–46.0)
Hemoglobin: 12.2 g/dL (ref 12.0–15.0)
Lymphocytes Relative: 24 % (ref 12.0–46.0)
Lymphs Abs: 2.1 10*3/uL (ref 0.7–4.0)
MCHC: 32.7 g/dL (ref 30.0–36.0)
MCV: 79.3 fl (ref 78.0–100.0)
Monocytes Absolute: 0.8 10*3/uL (ref 0.1–1.0)
Monocytes Relative: 8.9 % (ref 3.0–12.0)
Neutro Abs: 5.6 10*3/uL (ref 1.4–7.7)
Neutrophils Relative %: 65.1 % (ref 43.0–77.0)
Platelets: 326 10*3/uL (ref 150.0–400.0)
RBC: 4.7 Mil/uL (ref 3.87–5.11)
RDW: 16 % — ABNORMAL HIGH (ref 11.5–15.5)
WBC: 8.6 10*3/uL (ref 4.0–10.5)

## 2019-10-11 LAB — COMPREHENSIVE METABOLIC PANEL
ALT: 25 U/L (ref 0–35)
AST: 16 U/L (ref 0–37)
Albumin: 4.1 g/dL (ref 3.5–5.2)
Alkaline Phosphatase: 82 U/L (ref 39–117)
BUN: 11 mg/dL (ref 6–23)
CO2: 28 mEq/L (ref 19–32)
Calcium: 9.7 mg/dL (ref 8.4–10.5)
Chloride: 105 mEq/L (ref 96–112)
Creatinine, Ser: 0.75 mg/dL (ref 0.40–1.20)
GFR: 109.15 mL/min (ref >60.00)
Glucose, Bld: 91 mg/dL (ref 70–99)
Potassium: 4.4 mEq/L (ref 3.5–5.1)
Sodium: 136 mEq/L (ref 135–145)
Total Bilirubin: 0.3 mg/dL (ref 0.2–1.2)
Total Protein: 7.4 g/dL (ref 6.0–8.3)

## 2019-10-11 LAB — VITAMIN D 25 HYDROXY (VIT D DEFICIENCY, FRACTURES): VITD: 8.56 ng/mL — ABNORMAL LOW (ref 30.00–100.00)

## 2019-10-11 LAB — TSH: TSH: 1.89 u[IU]/mL (ref 0.35–4.50)

## 2019-10-11 MED ORDER — SERTRALINE HCL 50 MG PO TABS: 50.0000 mg | ORAL_TABLET | Freq: Every day | ORAL | 3 refills | Status: DC

## 2019-10-11 NOTE — Progress Notes (Signed)
Subjective:    Patient ID: Valerie Mccarthy, female    DOB: 10-09-1990, 29 y.o.   MRN: [id number]  HPI Chief Complaint  Patient presents with  . Depression    Discuss Lexapro - stopped taking about 2-3 months ago d/t increased fatigue    Started a new job in Jan. 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 Hates her job. Has been looking for something else. Has been causing her physical and mental stressors. Works 2-11 pm. Tried taking 1/2 escitalopram but still made her feel fatigued. Boyfriend supportive. Sleeps 5-6 hours a night. Working 40 hours a week.  Feels down and low, over thinking about everything.   Poor diet- eating fast food every day.    Review of Systems Per HPI    Objective:   Physical Exam Vitals reviewed.  Constitutional:      General: She is not in acute distress.    Appearance: Normal appearance. She is obese. She is not ill-appearing, toxic-appearing or diaphoretic.  HENT:     Head: Normocephalic and atraumatic.  Eyes:     Conjunctiva/sclera: Conjunctivae normal.  Cardiovascular:     Rate and Rhythm: Normal rate and regular rhythm.     Heart sounds: Normal heart sounds.  Pulmonary:     Effort: Pulmonary effort is normal.     Breath sounds: Normal breath sounds.  Musculoskeletal:     Right lower leg: No edema.     Left lower leg: No edema.  Skin:    General: Skin is warm and dry.  Neurological:     Mental Status: She is alert and oriented to person, place, and time.  Psychiatric:        Mood and Affect: Mood normal.        Behavior: Behavior normal.        Thought Content: Thought content normal.        Judgment: Judgment normal.       BP 102/60 (BP Location: Left Arm, Patient Position: Sitting, Cuff Size: Large)   Pulse 86   Temp 97.6 F (36.4 C) (Temporal)   Ht 5\' 2"  (1.575 m)   Wt (!) 324 lb 6.4 oz (147.1 kg)   SpO2 98%   BMI 59.33 kg/m  Wt Readings from Last 3 Encounters:  10/11/19 (!) 324 lb 6.4 oz (147.1 kg)  10/22/18 296 lb 8 oz (134.5  kg)  06/25/18 299 lb (135.6 kg)   GAD 7 : Generalized Anxiety Score 10/11/2019 10/22/2018  Nervous, Anxious, on Edge 1 1  Control/stop worrying 3 1  Worry too much - different things 3 1  Trouble relaxing 3 1  Restless 0 1  Easily annoyed or irritable 0 1  Afraid - awful might happen 0 1  Total GAD 7 Score 10 7  Anxiety Difficulty Very difficult -    Depression screen [facility] 2/9 10/11/2019 10/22/2018 06/06/2018 11/10/2017  Decreased Interest 1 1 0 0  Down, Depressed, Hopeless 1 1 1  0  PHQ - 2 Score 2 2 1  0  Altered sleeping 1 1 - -  Tired, decreased energy 1 1 - -  Change in appetite 1 1 - -  Feeling bad or failure about yourself  3 1 - -  Trouble concentrating 3 0 - -  Moving slowly or fidgety/restless 0 0 - -  Suicidal thoughts 0 0 - -  PHQ-9 Score 11 6 - -  Difficult doing work/chores Very difficult - - -       Assessment & Plan:  1. Anxiety and depression - discussed importance of therapy, will try different medication, may need to add buspirone if too sedating. - provided information about non medication interventions - sertraline (ZOLOFT) 50 MG tablet; Take 1 tablet (50 mg total) by mouth daily.  Dispense: 30 tablet; Refill: 3 - follow up in 4-6 weeks, can be virtual  2. Morbid obesity (HCC) - discussed diet choices and encouraged her to work on meal planning/ prep, decrease fast food, processed foods - Vitamin D, 25-hydroxy - Ferritin - TSH - Comprehensive metabolic panel - CBC with Differential  This visit occurred during the SARS-CoV-2 public health emergency.  Safety protocols were in place, including screening questions prior to the visit, additional usage of staff PPE, and extensive cleaning of exam room while observing appropriate contact time as indicated for disinfecting solutions.    Olean Ree, FNP-BC  [facility], [facility]  10/11/2019 5:42 PM

## 2019-10-11 NOTE — Telephone Encounter (Signed)
Noted  

## 2019-10-11 NOTE — Patient Instructions (Signed)
Take sertraline 1/2 tablet before sleep for 5 days, then increase to 1 tablet  Follow up video visit in 4 weeks  Medication for depression and anxiety often takes 6-8 weeks to have a noticeable difference so stick with it. Also the best way for recovery is taking medication and seeing a therapist -- this is so important.      How to help anxiety and depression   1) Regular Exercise - walking, jogging, cycling, dancing, strength training - aiming for 150 minutes of exercise a week -- Yoga has been shown in research to reduce depression and anxiety -- with even just one hour long session per week -- Walk leisurely for 30 minutes every day  2)  Begin a Mindfulness/Meditation practice -- this can take a little as 3 minutes and is helpful for all kinds of mood issues -- You can find resources in books -- Or you can download apps like  -- Headspace App  -- Calm  -- Insignt Timer -- Stop, Breathe & Think   # With each of these Apps - you should decline the "start free trial" offer and as you search through the App should be able to access some of their free content. You can also chose to pay for the content if you find one that works well for you.    # Many of them also offer sleep specific content which may help with insomnia   3) Healthy Diet - Avoid fast foods and processed foods, eat mostly lean proteins, vegetables, fruits and whole grains -- Avoid or decrease Caffeine -- Avoid or decrease Alcohol -- Drink plenty of water, have a balanced diet -- Avoid cigarettes and marijuana (as well as other recreational drugs)   4) Consider contacting a professional therapist  [facility] [phone]

## 2019-10-14 ENCOUNTER — Other Ambulatory Visit: Payer: Self-pay | Admitting: Family Medicine

## 2019-10-14 NOTE — Telephone Encounter (Signed)
Last OV 10/11/19 Last refill was 11/15/2017 #12 x 3 refills.   Per recent Lab results pt was to start taking Vit D 50,000 again and Debbie's note said that a RX was sent in. I did nto see where an Rx was sent -- there have been issues with Epic since the most recent upgrade.   Will send to her to make her aware that this is another Vit D Rx that did not go through.   I have sent Rx with a 6 month supply.   Nothing further needed.

## 2019-11-08 ENCOUNTER — Encounter: Payer: Self-pay | Admitting: Family Medicine

## 2019-11-08 ENCOUNTER — Ambulatory Visit (INDEPENDENT_AMBULATORY_CARE_PROVIDER_SITE_OTHER): Payer: BC Managed Care – PPO | Admitting: Family Medicine

## 2019-11-08 ENCOUNTER — Other Ambulatory Visit: Payer: Self-pay

## 2019-11-08 VITALS — BP 132/80 | HR 81 | Temp 98.4°F | Ht 62.0 in | Wt 325.0 lb

## 2019-11-08 DIAGNOSIS — F329 Major depressive disorder, single episode, unspecified: Secondary | ICD-10-CM

## 2019-11-08 DIAGNOSIS — F32A Depression, unspecified: Secondary | ICD-10-CM

## 2019-11-08 DIAGNOSIS — F419 Anxiety disorder, unspecified: Secondary | ICD-10-CM

## 2019-11-08 NOTE — Progress Notes (Signed)
Subjective:    Patient ID: Valerie Mccarthy, female    DOB: [birth year], 29 y.o.   MRN: [id number]  HPI Chief Complaint  Patient presents with  . Depression    Medication seems to be helping a lot  Only taking 1/2 tablet d/t drowsiness with a whole tablet.   . Anxiety   This is a 29 yo female who presents today for follow up of depression and anxiety. Was seen 10/11/19 and started on sertraline.  It was making her very sleepy no matter what time of day she took it.  She has been taking one half of 50 mg tablet. Has been reading and writing. Has been walking more on her breaks.  She feels like she is dealing better with her job which has decreased her stress and anxiety.  Her boyfriend is supportive.  Smoking 1/2 ppd.   Review of Systems Per HPI    Objective:   Physical Exam Vitals reviewed.  Constitutional:      General: She is not in acute distress.    Appearance: Normal appearance. She is obese. She is not ill-appearing, toxic-appearing or diaphoretic.  HENT:     Head: Normocephalic and atraumatic.  Eyes:     Conjunctiva/sclera: Conjunctivae normal.  Cardiovascular:     Rate and Rhythm: Normal rate.  Pulmonary:     Effort: Pulmonary effort is normal.  Neurological:     Mental Status: She is alert and oriented to person, place, and time.  Psychiatric:        Mood and Affect: Mood normal.        Behavior: Behavior normal.        Thought Content: Thought content normal.        Judgment: Judgment normal.      BP 132/80 (BP Location: Left Arm, Patient Position: Sitting, Cuff Size: Large)   Pulse 81   Temp 98.4 F (36.9 C) (Temporal)   Ht 5\' 2"  (1.575 m)   Wt (!) 325 lb (147.4 kg)   SpO2 97%   BMI 59.44 kg/m  Wt Readings from Last 3 Encounters:  11/08/19 (!) 325 lb (147.4 kg)  10/11/19 (!) 324 lb 6.4 oz (147.1 kg)  10/22/18 296 lb 8 oz (134.5 kg)    Depression screen [facility] 2/9 11/08/2019 10/11/2019 10/22/2018 06/06/2018 11/10/2017  Decreased Interest 0 1 1 0 0  Down,  Depressed, Hopeless 0 1 1 1  0  PHQ - 2 Score 0 2 2 1  0  Altered sleeping 0 1 1 - -  Tired, decreased energy 1 1 1  - -  Change in appetite 0 1 1 - -  Feeling bad or failure about yourself  0 3 1 - -  Trouble concentrating 0 3 0 - -  Moving slowly or fidgety/restless 0 0 0 - -  Suicidal thoughts 0 0 0 - -  PHQ-9 Score 1 11 6  - -  Difficult doing work/chores Not difficult at all Very difficult - - -   GAD 7 : Generalized Anxiety Score 11/08/2019 10/11/2019 10/22/2018  Nervous, Anxious, on Edge 0 1 1  Control/stop worrying 0 3 1  Worry too much - different things 0 3 1  Trouble relaxing 0 3 1  Restless 0 0 1  Easily annoyed or irritable 0 0 1  Afraid - awful might happen 0 0 1  Total GAD 7 Score 0 10 7  Anxiety Difficulty Not difficult at all Very difficult -        Assessment &  Plan:  1. Anxiety and depression -Significant improvement on low-dose sertraline and with nonmedication interventions -Encouraged her to continue regular walking -She is due annual exam and will schedule that today  This visit occurred during the SARS-CoV-2 public health emergency.  Safety protocols were in place, including screening questions prior to the visit, additional usage of staff PPE, and extensive cleaning of exam room while observing appropriate contact time as indicated for disinfecting solutions.      Olean Ree, FNP-BC  [facility] at [facility], [facility]  11/09/2019 8:22 AM

## 2019-11-08 NOTE — Patient Instructions (Signed)
Good to see you today   Follow up for your physical in 3-4 months

## 2019-12-03 DIAGNOSIS — Z20822 Contact with and (suspected) exposure to covid-19: Secondary | ICD-10-CM | POA: Diagnosis not present

## 2019-12-17 ENCOUNTER — Telehealth: Payer: Self-pay | Admitting: *Deleted

## 2019-12-17 NOTE — Telephone Encounter (Signed)
Patient called stating that she tested positive for covid on 12/04/19 at [facility]. Patient stated that she is still feeling real bad. Patient stated that she did another covid test at [facility] on 12/13/19 and the test was negative. Patient stated that she is still having diarrhea, headache, body aches, fatigue, cough and nausea. Patient denies SOB or difficulty breathing. Patient was advised that she needs a face to face evaluation with her ongoing symptoms. Patient was given information on the Urgent Care at [facility]. Patient stated that she is going to call and see if they can schedule her an appointment and will plan on going to the Urgent Care.

## 2019-12-18 ENCOUNTER — Ambulatory Visit
Admission: RE | Admit: 2019-12-18 | Discharge: 2019-12-18 | Disposition: A | Discharge status: Home / Self Care | Payer: BC Managed Care – PPO | Source: Ambulatory Visit | Attending: Family Medicine | Admitting: Family Medicine

## 2019-12-18 ENCOUNTER — Other Ambulatory Visit: Payer: Self-pay

## 2019-12-18 ENCOUNTER — Ambulatory Visit
Admission: RE | Admit: 2019-12-18 | Discharge: 2019-12-18 | Disposition: A | Discharge status: Home / Self Care | Payer: BC Managed Care – PPO | Attending: Family Medicine | Admitting: Family Medicine

## 2019-12-18 VITALS — BP 130/82 | HR 89 | Temp 98.1°F | Resp 19

## 2019-12-18 DIAGNOSIS — R52 Pain, unspecified: Secondary | ICD-10-CM

## 2019-12-18 DIAGNOSIS — R05 Cough: Secondary | ICD-10-CM | POA: Insufficient documentation

## 2019-12-18 DIAGNOSIS — R0602 Shortness of breath: Secondary | ICD-10-CM | POA: Diagnosis not present

## 2019-12-18 DIAGNOSIS — R062 Wheezing: Secondary | ICD-10-CM

## 2019-12-18 DIAGNOSIS — R059 Cough, unspecified: Secondary | ICD-10-CM

## 2019-12-18 DIAGNOSIS — R519 Headache, unspecified: Secondary | ICD-10-CM

## 2019-12-18 DIAGNOSIS — R5383 Other fatigue: Secondary | ICD-10-CM

## 2019-12-18 DIAGNOSIS — B349 Viral infection, unspecified: Secondary | ICD-10-CM

## 2019-12-18 DIAGNOSIS — Z8616 Personal history of COVID-19: Secondary | ICD-10-CM | POA: Diagnosis not present

## 2019-12-18 DIAGNOSIS — R6883 Chills (without fever): Secondary | ICD-10-CM

## 2019-12-18 MED ORDER — METHYLPREDNISOLONE SODIUM SUCC 125 MG IJ SOLR
125.0000 mg | Freq: Once | INTRAMUSCULAR | Status: AC
  Administered 2019-12-18: 125 mg via INTRAMUSCULAR

## 2019-12-18 MED ORDER — HYDROCODONE-HOMATROPINE 5-1.5 MG/5ML PO SYRP: 5.0000 mL | ORAL_SOLUTION | Freq: Four times a day (QID) | ORAL | 0 refills | Status: DC | PRN

## 2019-12-18 MED ORDER — PREDNISONE 10 MG (21) PO TBPK: ORAL_TABLET | Freq: Every day | ORAL | 0 refills | Status: DC

## 2019-12-18 MED ORDER — ALBUTEROL SULFATE HFA 108 (90 BASE) MCG/ACT IN AERS
2.0000 | INHALATION_SPRAY | Freq: Once | RESPIRATORY_TRACT | Status: AC
  Administered 2019-12-18: 2 via RESPIRATORY_TRACT

## 2019-12-18 NOTE — ED Provider Notes (Signed)
[facility]   [id number] 12/18/19 Arrival Time: 0943   CC: COVID symptoms  SUBJECTIVE: History from: patient.  Valerie Mccarthy is a 29 y.o. female who presents with positive hx Covid on 12/04/19. Tested negative for Covid 12/12/19. Reports cough, fatigue, aches, chills, weakness, SOB, burning chest pain for the last week. Denies sick exposure to COVID, flu or strep. Denies recent travel. Has completed Moderna Covid vaccines. Has not taken OTC medications for this. There are no aggravating or alleviating factors. Denies previous symptoms in the past. Denies fever, sinus pain, rhinorrhea, sore throat, nausea, changes in bowel or bladder habits.    ROS: As per HPI.  All other pertinent ROS negative.     Past Medical History:  Diagnosis Date  . Depression    History reviewed. No pertinent surgical history. No Known Allergies No current facility-administered medications on file prior to encounter.   Current Outpatient Medications on File Prior to Encounter  Medication Sig Dispense Refill  . Cholecalciferol (VITAMIN D3) 1.25 MG (50000 UT) CAPS Take 1 capsule by mouth once a week 12 capsule 1  . sertraline (ZOLOFT) 50 MG tablet Take 1 tablet (50 mg total) by mouth daily. 30 tablet 3  . Vitamin D, Ergocalciferol, (DRISDOL) 50000 units CAPS capsule Take 1 capsule (50,000 Units total) by mouth every 7 (seven) days. 12 capsule 3   Social History   Socioeconomic History  . Marital status: Single    Spouse name: Not on file  . Number of children: Not on file  . Years of education: Not on file  . Highest education level: Not on file  Occupational History  . Not on file  Tobacco Use  . Smoking status: Light Tobacco Smoker  . Smokeless tobacco: Never Used  Substance and Sexual Activity  . Alcohol use: Yes    Comment: 2-3 weekly   . Drug use: Never  . Sexual activity: Yes    Partners: Male    Birth control/protection: None  Other Topics Concern  . Not on file  Social History  Narrative  . Not on file   Social Determinants of Health   Financial Resource Strain:   . Difficulty of Paying Living Expenses: Not on file  Food Insecurity:   . Worried About Programme researcher, broadcasting/film/video in the Last Year: Not on file  . Ran Out of Food in the Last Year: Not on file  Transportation Needs:   . Lack of Transportation (Medical): Not on file  . Lack of Transportation (Non-Medical): Not on file  Physical Activity:   . Days of Exercise per Week: Not on file  . Minutes of Exercise per Session: Not on file  Stress:   . Feeling of Stress : Not on file  Social Connections:   . Frequency of Communication with Friends and Family: Not on file  . Frequency of Social Gatherings with Friends and Family: Not on file  . Attends Religious Services: Not on file  . Active Member of Clubs or Organizations: Not on file  . Attends Banker Meetings: Not on file  . Marital Status: Not on file  Intimate Partner Violence:   . Fear of Current or Ex-Partner: Not on file  . Emotionally Abused: Not on file  . Physically Abused: Not on file  . Sexually Abused: Not on file   Family History  Problem Relation Age of Onset  . Obesity Mother   . Diabetes Mother   . Arthritis Mother   . Lupus Mother   .  Heart disease Father     OBJECTIVE:  Vitals:   12/18/19 0947  BP: 130/82  Pulse: 89  Resp: 19  Temp: 98.1 F (36.7 C)  SpO2: 98%     General appearance: alert; appears fatigued, but nontoxic; speaking in full sentences and tolerating own secretions HEENT: NCAT; Ears: EACs clear, TMs pearly gray; Eyes: PERRL.  EOM grossly intact. Sinuses: nontender; Nose: nares patent without rhinorrhea, Throat: oropharynx clear, tonsils non erythematous or enlarged, uvula midline  Neck: supple without LAD Lungs: unlabored respirations, symmetrical air entry; cough: moderate; no respiratory distress; diffuse wheezing throughout bilateral lung fields, diminished lung sounds to bilateral lower  lobes Heart: regular rate and rhythm.  Radial pulses 2+ symmetrical bilaterally Skin: warm and dry Psychological: alert and cooperative; normal mood and affect  LABS:  No results found for this or any previous visit (from the past 24 hour(s)).   ASSESSMENT & PLAN:  1. Viral illness   2. Cough   3. SOB (shortness of breath)   4. Other fatigue   5. History of COVID-19   6. Wheezing   7. Chills   8. Body aches   9. Nonintractable headache, unspecified chronicity pattern, unspecified headache type     Meds ordered this encounter  Medications  . methylPREDNISolone sodium succinate (SOLU-MEDROL) 125 mg/2 mL injection 125 mg  . albuterol (VENTOLIN HFA) 108 (90 Base) MCG/ACT inhaler 2 puff  . HYDROcodone-homatropine (HYCODAN) 5-1.5 MG/5ML syrup    Sig: Take 5 mLs by mouth every 6 (six) hours as needed for cough.    Dispense:  120 mL    Refill:  0    Order Specific Question:   Supervising Provider    Answer:   Merrilee Jansky [id number]  . predniSONE (STERAPRED UNI-PAK 21 TAB) 10 MG (21) TBPK tablet    Sig: Take by mouth daily. Take 6 tabs by mouth daily  for 2 days, then 5 tabs for 2 days, then 4 tabs for 2 days, then 3 tabs for 2 days, 2 tabs for 2 days, then 1 tab by mouth daily for 2 days    Dispense:  42 tablet    Refill:  0    Order Specific Question:   Supervising Provider    Answer:   Merrilee Jansky [[id number]]    Solumedrol 125mg  IM in office today Albuterol inhaler in office today Prescribed prednisone taper Hycodan cough syrup prescribed CXR negative CBC pending CMP pending Suspicious of Covid pneumonia given Covid hx and clinical presentation Strict ER precautions given Get plenty of rest and push fluids Use OTC zyrtec for nasal congestion, runny nose, and/or sore throat Use OTC flonase for nasal congestion and runny nose Use medications daily for symptom relief Use OTC medications like ibuprofen or tylenol as needed fever or pain Call or go to the ED if you  have any new or worsening symptoms such as fever, worsening cough, shortness of breath, chest tightness, chest pain, turning blue, changes in mental status.  Reviewed expectations re: course of current medical issues. Questions answered. Outlined signs and symptoms indicating need for more acute intervention. Patient verbalized understanding. After Visit Summary given.         , NP 12/18/19 1100

## 2019-12-18 NOTE — Telephone Encounter (Signed)
Noted  

## 2019-12-18 NOTE — Discharge Instructions (Signed)
I will call you with chest xray results  If blood work is abnormal, we will call you and treat accordingly  Go directly to the ER for worsening of symptoms

## 2019-12-18 NOTE — ED Triage Notes (Signed)
Patient reports she tested positive for covid on 12/04/19. Reports she tested again last week 12/12/19 and it was negative but patient reports continued nausea, chills, body aches, non-productive cough, sore throat, fatigue, weakness, and 8/10 headaches.

## 2019-12-18 NOTE — Telephone Encounter (Signed)
Please call patient and see if she was able to be seen at urgent care? If needed, please schedule her for a virtual visit.

## 2019-12-18 NOTE — Telephone Encounter (Signed)
Patient seen at Urgent Care today. See notes.

## 2019-12-19 LAB — CBC WITH DIFFERENTIAL/PLATELET
Basophils Absolute: 0 10*3/uL (ref 0.0–0.2)
Basos: 0 %
EOS (ABSOLUTE): 0.2 10*3/uL (ref 0.0–0.4)
Eos: 2 %
Hematocrit: 37.5 % (ref 34.0–46.6)
Hemoglobin: 12.3 g/dL (ref 11.1–15.9)
Immature Grans (Abs): 0 10*3/uL (ref 0.0–0.1)
Immature Granulocytes: 1 %
Lymphocytes Absolute: 2.1 10*3/uL (ref 0.7–3.1)
Lymphs: 25 %
MCH: 26.1 pg — ABNORMAL LOW (ref 26.6–33.0)
MCHC: 32.8 g/dL (ref 31.5–35.7)
MCV: 79 fL (ref 79–97)
Monocytes Absolute: 0.6 10*3/uL (ref 0.1–0.9)
Monocytes: 7 %
Neutrophils Absolute: 5.5 10*3/uL (ref 1.4–7.0)
Neutrophils: 65 %
Platelets: 326 10*3/uL (ref 150–450)
RBC: 4.72 x10E6/uL (ref 3.77–5.28)
RDW: 14.8 % (ref 11.7–15.4)
WBC: 8.5 10*3/uL (ref 3.4–10.8)

## 2019-12-19 LAB — COMPREHENSIVE METABOLIC PANEL
ALT: 31 IU/L (ref 0–32)
AST: 21 IU/L (ref 0–40)
Albumin/Globulin Ratio: 1.3 (ref 1.2–2.2)
Albumin: 4 g/dL (ref 3.8–4.8)
Alkaline Phosphatase: 93 IU/L (ref 48–121)
BUN/Creatinine Ratio: 10 (ref 9–23)
BUN: 7 mg/dL (ref 6–20)
Bilirubin Total: <0.2 mg/dL (ref 0.0–1.2)
CO2: 24 mmol/L (ref 20–29)
Calcium: 8.9 mg/dL (ref 8.7–10.2)
Chloride: 103 mmol/L (ref 96–106)
Creatinine, Ser: 0.68 mg/dL (ref 0.57–1.00)
GFR calc Af Amer: 135 mL/min/{1.73_m2} (ref >59)
GFR calc non Af Amer: 117 mL/min/{1.73_m2} (ref >59)
Globulin, Total: 3.2 g/dL (ref 1.5–4.5)
Glucose: 106 mg/dL — ABNORMAL HIGH (ref 65–99)
Potassium: 4.2 mmol/L (ref 3.5–5.2)
Sodium: 138 mmol/L (ref 134–144)
Total Protein: 7.2 g/dL (ref 6.0–8.5)

## 2019-12-20 ENCOUNTER — Telehealth: Payer: Self-pay | Admitting: Family Medicine

## 2019-12-20 NOTE — Telephone Encounter (Signed)
Pt went to urgent care 12/18/19  They took pt out of work till 8/28 returned to work 8/29.    Pt has been out of work from 12/13/2019 to 12/21/2019 return to work 8/29  Pt had covid dx 12/04/19 results

## 2019-12-22 ENCOUNTER — Emergency Department (HOSPITAL_COMMUNITY)
Admission: EM | Admit: 2019-12-22 | Discharge: 2019-12-23 | Disposition: A | Discharge status: Home / Self Care | Payer: BC Managed Care – PPO | Attending: Emergency Medicine | Admitting: Emergency Medicine

## 2019-12-22 ENCOUNTER — Encounter (HOSPITAL_COMMUNITY): Payer: Self-pay | Admitting: Emergency Medicine

## 2019-12-22 ENCOUNTER — Other Ambulatory Visit: Payer: Self-pay

## 2019-12-22 DIAGNOSIS — Z5321 Procedure and treatment not carried out due to patient leaving prior to being seen by health care provider: Secondary | ICD-10-CM | POA: Diagnosis not present

## 2019-12-22 DIAGNOSIS — R110 Nausea: Secondary | ICD-10-CM | POA: Insufficient documentation

## 2019-12-22 DIAGNOSIS — R232 Flushing: Secondary | ICD-10-CM | POA: Insufficient documentation

## 2019-12-22 DIAGNOSIS — R5382 Chronic fatigue, unspecified: Secondary | ICD-10-CM | POA: Insufficient documentation

## 2019-12-22 HISTORY — DX: COVID-19: U07.1

## 2019-12-22 NOTE — ED Triage Notes (Signed)
Pt to ED with c/o being nauseated, feeling tired and having hot flashes for 1 week

## 2019-12-23 ENCOUNTER — Telehealth: Payer: Self-pay | Admitting: *Deleted

## 2019-12-23 LAB — CBC WITH DIFFERENTIAL/PLATELET
Abs Immature Granulocytes: 0.04 10*3/uL (ref 0.00–0.07)
Basophils Absolute: 0 10*3/uL (ref 0.0–0.1)
Basophils Relative: 0 %
Eosinophils Absolute: 0.2 10*3/uL (ref 0.0–0.5)
Eosinophils Relative: 2 %
HCT: 38.5 % (ref 36.0–46.0)
Hemoglobin: 11.9 g/dL — ABNORMAL LOW (ref 12.0–15.0)
Immature Granulocytes: 0 %
Lymphocytes Relative: 26 %
Lymphs Abs: 2.6 10*3/uL (ref 0.7–4.0)
MCH: 24.7 pg — ABNORMAL LOW (ref 26.0–34.0)
MCHC: 30.9 g/dL (ref 30.0–36.0)
MCV: 79.9 fL — ABNORMAL LOW (ref 80.0–100.0)
Monocytes Absolute: 0.9 10*3/uL (ref 0.1–1.0)
Monocytes Relative: 9 %
Neutro Abs: 6.1 10*3/uL (ref 1.7–7.7)
Neutrophils Relative %: 63 %
Platelets: 350 10*3/uL (ref 150–400)
RBC: 4.82 MIL/uL (ref 3.87–5.11)
RDW: 14.5 % (ref 11.5–15.5)
WBC: 9.7 10*3/uL (ref 4.0–10.5)
nRBC: 0 % (ref 0.0–0.2)

## 2019-12-23 LAB — COMPREHENSIVE METABOLIC PANEL
ALT: 38 U/L (ref 0–44)
AST: 22 U/L (ref 15–41)
Albumin: 3.4 g/dL — ABNORMAL LOW (ref 3.5–5.0)
Alkaline Phosphatase: 76 U/L (ref 38–126)
Anion gap: 9 (ref 5–15)
BUN: 9 mg/dL (ref 6–20)
CO2: 25 mmol/L (ref 22–32)
Calcium: 8.9 mg/dL (ref 8.9–10.3)
Chloride: 102 mmol/L (ref 98–111)
Creatinine, Ser: 0.78 mg/dL (ref 0.44–1.00)
GFR calc Af Amer: >60 mL/min (ref >60)
GFR calc non Af Amer: >60 mL/min (ref >60)
Glucose, Bld: 94 mg/dL (ref 70–99)
Potassium: 4.1 mmol/L (ref 3.5–5.1)
Sodium: 136 mmol/L (ref 135–145)
Total Bilirubin: 0.4 mg/dL (ref 0.3–1.2)
Total Protein: 7.4 g/dL (ref 6.5–8.1)

## 2019-12-23 LAB — I-STAT BETA HCG BLOOD, ED (MC, WL, AP ONLY): I-stat hCG, quantitative: <5 m[IU]/mL (ref <5)

## 2019-12-23 NOTE — ED Notes (Signed)
Pt states she is leaving  

## 2019-12-23 NOTE — Telephone Encounter (Signed)
Noted. Agree with recommendation for ER evaluation.

## 2019-12-23 NOTE — Telephone Encounter (Signed)
Patient called stating that she tested positive for coivd 12/04/19. Patient stated that she took another covid test on 12/12/19 and it came back negative. Patient stated that she went to the urgent care last week and she was told the negative covid test was probably false. Patient stated that she has been taking the medication given to her at the Urgent Care. Patient stated that she had a spell last night where she could not breath. Patient stated that she went to the ER at [facility] and the wait was so long that she left. Patient stated that she is concerned that she may have a blood clot.Patient stated that she is still having SOB today.  Patient stated that her cycle is 5 days late and she has done pregnancy test and they were negative. Patient stated that she is still having difficulty breathing. Patient was advised that she needs to go back to the ER to be evaluated. Patient stated that she will try to go to the ER at [facility].

## 2019-12-24 ENCOUNTER — Other Ambulatory Visit: Payer: Self-pay

## 2019-12-24 ENCOUNTER — Emergency Department: Payer: BC Managed Care – PPO

## 2019-12-24 ENCOUNTER — Emergency Department
Admission: EM | Admit: 2019-12-24 | Discharge: 2019-12-25 | Disposition: A | Discharge status: Home / Self Care | Payer: BC Managed Care – PPO | Attending: Emergency Medicine | Admitting: Emergency Medicine

## 2019-12-24 ENCOUNTER — Encounter: Payer: Self-pay | Admitting: Emergency Medicine

## 2019-12-24 DIAGNOSIS — R05 Cough: Secondary | ICD-10-CM | POA: Diagnosis not present

## 2019-12-24 DIAGNOSIS — B349 Viral infection, unspecified: Secondary | ICD-10-CM | POA: Insufficient documentation

## 2019-12-24 DIAGNOSIS — R0600 Dyspnea, unspecified: Secondary | ICD-10-CM | POA: Diagnosis not present

## 2019-12-24 DIAGNOSIS — R059 Cough, unspecified: Secondary | ICD-10-CM

## 2019-12-24 DIAGNOSIS — U071 COVID-19: Secondary | ICD-10-CM | POA: Diagnosis not present

## 2019-12-24 DIAGNOSIS — R5381 Other malaise: Secondary | ICD-10-CM | POA: Diagnosis not present

## 2019-12-24 DIAGNOSIS — R5383 Other fatigue: Secondary | ICD-10-CM | POA: Diagnosis not present

## 2019-12-24 DIAGNOSIS — F17200 Nicotine dependence, unspecified, uncomplicated: Secondary | ICD-10-CM | POA: Diagnosis not present

## 2019-12-24 DIAGNOSIS — R0602 Shortness of breath: Secondary | ICD-10-CM | POA: Diagnosis not present

## 2019-12-24 MED ORDER — HYDROCODONE-CHLORPHENIRAMINE 5-4 MG/5ML PO SOLN
5.0000 mL | Freq: Four times a day (QID) | ORAL | 0 refills | Status: DC | PRN
Start: 2019-12-24 — End: 2021-02-25

## 2019-12-24 MED ORDER — ONDANSETRON 4 MG PO TBDP: ORAL_TABLET | ORAL | 0 refills | Status: DC

## 2019-12-24 MED ORDER — ALBUTEROL SULFATE HFA 108 (90 BASE) MCG/ACT IN AERS: INHALATION_SPRAY | RESPIRATORY_TRACT | 0 refills | Status: DC

## 2019-12-24 NOTE — ED Triage Notes (Signed)
Pt reports recently diagnosed with COVID but then had a negative test but is still having dizziness, fatigue, nausea and feels SOB.

## 2019-12-24 NOTE — Discharge Instructions (Addendum)
Your workup in the Emergency Department today was reassuring.  We did not find any specific abnormalities, and your chest x-ray is clear and your vital signs are normal.  We recommend you drink plenty of fluids, take your regular medications and/or any new ones prescribed today, and follow up with the doctor(s) listed in these documents as recommended.  Return to the Emergency Department if you develop new or worsening symptoms that concern you.

## 2019-12-24 NOTE — ED Provider Notes (Signed)
[facility] Emergency Department Provider Note  ____________________________________________   First MD Initiated Contact with Patient 12/24/19 2303     (approximate)  I have reviewed the triage vital signs and the nursing notes.   HISTORY  Chief Complaint No chief complaint on file.    HPI Lei Pricilla Holmucker is a [age] y.o. female with medical history as listed below which notably includes full immunization for COVID-19 with Moderna but subsequent positive COVID-19 test at [facility] on 12/04/2019.  She has had symptoms for about 2 weeks.  The symptoms include fatigue, general malaise, nausea, cough, congestion, and generalized body aches.  She felt better for a few days but then she started feeling worse again for about a week.  She had a subsequent Covid test that was negative within about a week but it is unclear if this was a false negative given her ongoing and persistent symptoms.  She has been in communication with her primary care provider and has been to an urgent care and was prescribed prednisone which she is still taking and has about 4 days left.  She came in tonight because of her persistent fatigue and just not feeling well.  She said that she has had an episode of feeling short of breath while she was talking on the phone but mostly she does not have difficulty breathing.  She does have a persistent nonproductive cough.  Rest of the symptoms are as described above.  She denies chest pain.  She has some pain along the upper part of her abdomen on both sides particularly when she coughs, moves around, or takes a deep breath but it is minimal.  No vomiting but persistent nausea.  No dysuria.  Subjective fever and chills.  Nothing in particular makes her symptoms better.         Past Medical History:  Diagnosis Date  . COVID-19   . Depression     Patient Active Problem List   Diagnosis Date Noted  . Morbid obesity (HCC) 10/22/2018  . Menorrhagia with regular  cycle 10/22/2018  . Anxiety and depression 10/22/2018  . Moderate recurrent major depression (HCC) 11/22/2017    History reviewed. No pertinent surgical history.  Prior to Admission medications   Medication Sig Start Date End Date Taking? Authorizing Provider  albuterol (VENTOLIN HFA) 108 (90 Base) MCG/ACT inhaler Inhale 2-4 puffs by mouth every 4 hours as needed for wheezing, cough, and/or shortness of breath 12/24/19   Loleta RoseForbach, Yeraldin Litzenberger, MD  Cholecalciferol (VITAMIN D3) 1.25 MG (50000 UT) CAPS Take 1 capsule by mouth once a week 10/14/19   Emi BelfastGessner, Deborah B, FNP  HYDROcodone-Chlorpheniramine 5-4 MG/5ML SOLN Take 5 mLs by mouth every 6 (six) hours as needed. 12/24/19   Loleta RoseForbach, Vesper Trant, MD  HYDROcodone-homatropine [facility](HYCODAN) 5-1.5 MG/5ML syrup Take 5 mLs by mouth every 6 (six) hours as needed for cough. 12/18/19   Moshe CiproMatthews, Stephanie, NP  ondansetron (ZOFRAN ODT) 4 MG disintegrating tablet Allow 1-2 tablets to dissolve in your mouth every 8 hours as needed for nausea/vomiting 12/24/19   Loleta RoseForbach, Nathaniel Wakeley, MD  predniSONE (STERAPRED UNI-PAK 21 TAB) 10 MG (21) TBPK tablet Take by mouth daily. Take 6 tabs by mouth daily  for 2 days, then 5 tabs for 2 days, then 4 tabs for 2 days, then 3 tabs for 2 days, 2 tabs for 2 days, then 1 tab by mouth daily for 2 days 12/18/19   Moshe CiproMatthews, Stephanie, NP  sertraline (ZOLOFT) 50 MG tablet Take 1 tablet (50 mg  total) by mouth daily. 10/11/19   Emi Belfast, FNP  Vitamin D, Ergocalciferol, (DRISDOL) 50000 units CAPS capsule Take 1 capsule (50,000 Units total) by mouth every 7 (seven) days. 11/15/17   Emi Belfast, FNP    Allergies Patient has no known allergies.  Family History  Problem Relation Age of Onset  . Obesity Mother   . Diabetes Mother   . Arthritis Mother   . Lupus Mother   . Heart disease Father     Social History Social History   Tobacco Use  . Smoking status: Light Tobacco Smoker  . Smokeless tobacco: Never Used  Substance Use Topics  .  Alcohol use: Yes    Comment: 2-3 weekly   . Drug use: Never    Review of Systems Constitutional: Subjective fever/chills.  Fatigue and malaise. Eyes: No visual changes. ENT: Nasal congestion.  No sore throat. Cardiovascular: Denies chest pain. Respiratory: Frequent nonproductive cough.  Occasional episodic shortness of breath. Gastrointestinal: No abdominal pain.  nausea, no vomiting.  No diarrhea.  No constipation. Genitourinary: Negative for dysuria. Musculoskeletal: Generalized body aches.  Negative for neck pain.  Negative for back pain. Integumentary: Negative for rash. Neurological: Negative for headaches, focal weakness or numbness.   ____________________________________________   PHYSICAL EXAM:  VITAL SIGNS: ED Triage Vitals  Enc Vitals Group     BP 12/24/19 1349 139/77     Pulse Rate 12/24/19 1349 86     Resp 12/24/19 1349 18     Temp 12/24/19 1349 98.9 F (37.2 C)     Temp Source 12/24/19 1349 Oral     SpO2 12/24/19 1349 100 %     Weight 12/24/19 1340 (!) 142.9 kg (315 lb)     Height 12/24/19 1340 1.575 m (5\' 2" )     Head Circumference --      Peak Flow --      Pain Score 12/24/19 1340 0     Pain Loc --      Pain Edu? --      Excl. in GC? --     Constitutional: Alert and oriented.  Eyes: Conjunctivae are normal.  Head: Atraumatic. Nose: No congestion/rhinnorhea. Mouth/Throat: Patient is wearing a mask. Neck: No stridor.  No meningeal signs.   Cardiovascular: Normal rate, regular rhythm. Good peripheral circulation. Grossly normal heart sounds. Respiratory: Normal respiratory effort.  No retractions. Gastrointestinal: Soft and nontender. No distention.  Musculoskeletal: No lower extremity tenderness nor edema. No gross deformities of extremities. Neurologic:  Normal speech and language. No gross focal neurologic deficits are appreciated.  Skin:  Skin is warm, dry and intact. Psychiatric: Mood and affect are depressed but appropriate under the  circumstances.  No warning signs.  ____________________________________________   LABS (all labs ordered are listed, but only abnormal results are displayed)  Labs Reviewed - No data to display ____________________________________________  EKG  No indication for emergent EKG ____________________________________________  RADIOLOGY I, 12/26/19, personally viewed and evaluated these images (plain radiographs) as part of my medical decision making, as well as reviewing the written report by the radiologist.  ED MD interpretation:  No acute abnormalities  Official radiology report(s): DG Chest Portable 1 View  Result Date: 12/24/2019 CLINICAL DATA:  Dyspnea, cough, COVID-19 positive 12/04/2019 EXAM: PORTABLE CHEST 1 VIEW COMPARISON:  12/18/2019 FINDINGS: The heart size and mediastinal contours are within normal limits. Both lungs are clear. The visualized skeletal structures are unremarkable. IMPRESSION: No active disease. Electronically Signed   By: 12/20/2019.D.  On: 12/24/2019 23:37    ____________________________________________   PROCEDURES   Procedure(s) performed (including Critical Care):  .1-3 Lead EKG Interpretation Performed by: Loleta Rose, MD Authorized by: Loleta Rose, MD     Interpretation: normal     ECG rate:  71   ECG rate assessment: normal     Rhythm: sinus rhythm     Ectopy: none     Conduction: normal       ____________________________________________   INITIAL IMPRESSION / MDM / ASSESSMENT AND PLAN / ED COURSE  As part of my medical decision making, I reviewed the following data within the electronic MEDICAL RECORD NUMBER History obtained from family, Nursing notes reviewed and incorporated, Old chart reviewed, Radiograph reviewed  and Notes from prior ED visits and reviewed [place] controlled substance database   Differential diagnosis includes, but is not limited to, COVID-19, other viral respiratory illness,  community-acquired pneumonia.  The patient is generally well-appearing, she just seems tired and has been having ongoing symptoms for a couple of weeks.  She is frustrated that she is not feeling better yet.  She has a normal heart rate, normal blood pressure, no hypoxemia, and no tachypnea.  She is afebrile in the emergency department over the more than 10 hours that she has been waiting for an exam bed due to overwhelming ED and hospital patient volumes.  We discussed her symptoms and her reassuring presentation overall.  We agreed that an extensive work-up is unnecessary.  I think it is very unlikely that the symptoms that are mostly bothering her are due to an emergent medical condition such as pulmonary embolism, for example, given in particular the lack of dyspnea, tachycardia, hypoxemia, and tachypnea (although she was very slightly tachypneic after walking in for triage).  I recommended that we obtain a chest x-ray to look for signs of Covid pneumonia or community-acquired pneumonia and treat appropriately but that most likely she will be able to continue her prednisone.  I will write a prescription for an albuterol inhaler and Zofran as well as another prescription for cough medicine and encouraged her to follow-up as an outpatient with her primary care provider.  I gave my usual and customary return precautions and she understands and agrees with the plan.  The patient is on the cardiac monitor to evaluate for evidence of arrhythmia and/or significant heart rate changes.     Clinical Course as of Dec 25 30  Wed Dec 25, 2019  0014 Normal chest x-ray.  I will proceed with the plan for discharge and outpatient follow-up as previously described.   [CF]    Clinical Course User Index [CF] Loleta Rose, MD     ____________________________________________  FINAL CLINICAL IMPRESSION(S) / ED DIAGNOSES  Final diagnoses:  Acute viral syndrome  Malaise and fatigue  Cough     MEDICATIONS  GIVEN DURING THIS VISIT:  Medications - No data to display   ED Discharge Orders         Ordered    albuterol (VENTOLIN HFA) 108 (90 Base) MCG/ACT inhaler       Note to Pharmacy: Pharmacy may substitute brand and size for insurance-approved equivalent   12/24/19 2332    HYDROcodone-Chlorpheniramine 5-4 MG/5ML SOLN  Every 6 hours PRN        12/24/19 2332    ondansetron (ZOFRAN ODT) 4 MG disintegrating tablet        12/24/19 2332          *Please note:  Micaella Ems was  evaluated in Emergency Department on 12/25/2019 for the symptoms described in the history of present illness. She was evaluated in the context of the global COVID-19 pandemic, which necessitated consideration that the patient might be at risk for infection with the SARS-CoV-2 virus that causes COVID-19. Institutional protocols and algorithms that pertain to the evaluation of patients at risk for COVID-19 are in a state of rapid change based on information released by regulatory bodies including the CDC and federal and state organizations. These policies and algorithms were followed during the patient's care in the ED.  Some ED evaluations and interventions may be delayed as a result of limited staffing during and after the pandemic.*  Note:  This document was prepared using Dragon voice recognition software and may include unintentional dictation errors.   Loleta Rose, MD 12/25/19 [id number]

## 2019-12-25 ENCOUNTER — Telehealth: Payer: Self-pay | Admitting: Adult Health

## 2019-12-25 ENCOUNTER — Encounter: Payer: Self-pay | Admitting: Family Medicine

## 2019-12-25 ENCOUNTER — Telehealth: Payer: Self-pay | Admitting: Family Medicine

## 2019-12-25 NOTE — Telephone Encounter (Signed)
As we discussed, I would need to either have a virtual visit with her or I recommend that she talk with them at the post Covid center where she has an appointment on 12/27/2019.

## 2019-12-25 NOTE — Telephone Encounter (Signed)
Called patient to discuss COVID 19 symptoms to determine what plan of action may meet her needs.  There was no answer and her voice mail was not set up.  My chart message sent.  Our call back number is [phone].  Lillard Anes, NP

## 2019-12-25 NOTE — Telephone Encounter (Signed)
Spoke with pt she stated she will check with post covid clinic to see if they can fill out paperwork for her.  She will take her paperwork with her to appointment.  If they cannot fill out paperwork she will call office to schedule virtual appointment

## 2019-12-25 NOTE — Telephone Encounter (Signed)
Tried calling pt voicemail not set up °

## 2019-12-25 NOTE — Telephone Encounter (Signed)
fmla paperwork in debbie's In box Pt has an appointment @ [facility] 9/3 Pt was at er yesterday 8/31

## 2019-12-25 NOTE — Telephone Encounter (Signed)
Patient called in stating she believes she missed a call from Robin in regards to paperwork. Please advise.

## 2019-12-25 NOTE — Telephone Encounter (Signed)
Valerie Mccarthy is a 29 year old woman who I am calling today at the request of Dr. York Cerise and Dr. Delford Field.  She notes that she tested positive for COVID 19 on 12/04/2019.  She improved, however a couple of weeks later she worsened.  She has been experienced dizziness, dyspnea on exertion, increased weakness, nausea, migraines.  She was seen in the ER last night, and was prescribed Prednisone, an inhaler, and cough medicine.    Shandel is worried and feels like she is being passed around, and that she is feeling poorly.  I told her to drink plenty of water every day.  I told her to eat plenty of vegetables.  I talked to her about walking for one minute twice a day, and slowly increasing that if she is able.  I will call and get her in with the post covid care clinic for follow up.    Lillard Anes, NP

## 2019-12-27 ENCOUNTER — Ambulatory Visit (INDEPENDENT_AMBULATORY_CARE_PROVIDER_SITE_OTHER): Payer: BC Managed Care – PPO | Admitting: Nurse Practitioner

## 2019-12-27 ENCOUNTER — Other Ambulatory Visit: Payer: Self-pay

## 2019-12-27 VITALS — BP 128/72 | HR 70 | Temp 97.7°F | Ht 62.0 in | Wt 326.0 lb

## 2019-12-27 DIAGNOSIS — N912 Amenorrhea, unspecified: Secondary | ICD-10-CM | POA: Diagnosis not present

## 2019-12-27 DIAGNOSIS — R0602 Shortness of breath: Secondary | ICD-10-CM | POA: Diagnosis not present

## 2019-12-27 DIAGNOSIS — Z8616 Personal history of COVID-19: Secondary | ICD-10-CM | POA: Diagnosis not present

## 2019-12-27 LAB — POCT URINE PREGNANCY: Preg Test, Ur: NEGATIVE

## 2019-12-27 NOTE — Telephone Encounter (Signed)
Pt is aware paperwork is being faxed to post covid clinic   Fax # [phone] [facility] faxed

## 2019-12-27 NOTE — Progress Notes (Signed)
@Patient  ID: , female    DOB: 1990-09-01, 29 y.o.   MRN: [id number]  Chief Complaint  Patient presents with  . Covid Positive    Tested positive 8/11. Sx: SOB, Nausea and fatigue. Hasnt taken any Zofran, on prednisone from ED and inhaler    Referring provider: 10/11, FNP   29 year old female with history of depression, obesity, and anxiety. Diagnosed with Covid on 12/04/2019.  HPI  Patient presents today for post COVID care clinic visit.  Patient was diagnosed with Covid on 12/04/2019.  Patient was seen in the ED on 12/24/2019.  Chest x-ray was clear. Labs were overall normal. Patient was prescribed cough medicine and albuterol.  Patient is currently taking prednisone. Patient reports she has not had her menstrual cycle since mid July. She is requesting pregnancy test today. Patient reports that she is still having extreme fatigue and shortness of breath with exertion. Vital signs in the office today are normal. Patient also reports generalized body aches. Denies f/c/s, n/v/d, hemoptysis, PND, chest pain or edema.      No Known Allergies  Immunization History  Administered Date(s) Administered  . Influenza,inj,Quad PF,6+ Mos 02/16/2018  . Tdap 05/10/2016    Past Medical History:  Diagnosis Date  . COVID-19   . Depression     Tobacco History: Social History   Tobacco Use  Smoking Status Light Tobacco Smoker  . Packs/day: 0.25  Smokeless Tobacco Never Used   Ready to quit: No Counseling given: Yes   Outpatient Encounter Medications as of 12/27/2019  Medication Sig  . albuterol (VENTOLIN HFA) 108 (90 Base) MCG/ACT inhaler Inhale 2-4 puffs by mouth every 4 hours as needed for wheezing, cough, and/or shortness of breath  . Cholecalciferol (VITAMIN D3) 1.25 MG (50000 UT) CAPS Take 1 capsule by mouth once a week  . HYDROcodone-Chlorpheniramine 5-4 MG/5ML SOLN Take 5 mLs by mouth every 6 (six) hours as needed.  02/26/2020 HYDROcodone-homatropine (HYCODAN)  5-1.5 MG/5ML syrup Take 5 mLs by mouth every 6 (six) hours as needed for cough.  . ondansetron (ZOFRAN ODT) 4 MG disintegrating tablet Allow 1-2 tablets to dissolve in your mouth every 8 hours as needed for nausea/vomiting  . predniSONE (STERAPRED UNI-PAK 21 TAB) 10 MG (21) TBPK tablet Take by mouth daily. Take 6 tabs by mouth daily  for 2 days, then 5 tabs for 2 days, then 4 tabs for 2 days, then 3 tabs for 2 days, 2 tabs for 2 days, then 1 tab by mouth daily for 2 days  . sertraline (ZOLOFT) 50 MG tablet Take 1 tablet (50 mg total) by mouth daily.  . Vitamin D, Ergocalciferol, (DRISDOL) 50000 units CAPS capsule Take 1 capsule (50,000 Units total) by mouth every 7 (seven) days.   No facility-administered encounter medications on file as of 12/27/2019.     Review of Systems  Review of Systems  Constitutional: Positive for fatigue. Negative for chills and fever.  HENT: Negative.   Respiratory: Positive for cough and shortness of breath.   Cardiovascular: Negative.   Gastrointestinal: Negative.   Allergic/Immunologic: Negative.   Neurological: Negative.   Psychiatric/Behavioral: Negative.        Physical Exam  BP 128/72 (BP Location: Left Arm)   Pulse 70   Temp 97.7 F (36.5 C)   Ht 5\' 2"  (1.575 m)   Wt (!) 326 lb (147.9 kg)   LMP 11/17/2019   SpO2 99%   BMI 59.63 kg/m   Wt Readings from Last  5 Encounters:  12/27/19 (!) 326 lb (147.9 kg)  12/24/19 (!) 315 lb (142.9 kg)  12/22/19 (!) 305 lb (138.3 kg)  11/08/19 (!) 325 lb (147.4 kg)  10/11/19 (!) 324 lb 6.4 oz (147.1 kg)     Physical Exam Vitals and nursing note reviewed.  Constitutional:      General: She is not in acute distress.    Appearance: She is well-developed.  Cardiovascular:     Rate and Rhythm: Normal rate and regular rhythm.  Pulmonary:     Effort: Pulmonary effort is normal.     Breath sounds: Normal breath sounds.  Neurological:     Mental Status: She is alert and oriented to person, place, and  time.      Imaging: DG Chest 2 View  Result Date: 12/18/2019 CLINICAL DATA:  Cough, shortness of breath. EXAM: CHEST - 2 VIEW COMPARISON:  None. FINDINGS: The heart size and mediastinal contours are within normal limits. Both lungs are clear. No pneumothorax or pleural effusion is noted. The visualized skeletal structures are unremarkable. IMPRESSION: No active cardiopulmonary disease. Electronically Signed   By: Lupita Raider M.D.   On: 12/18/2019 10:54   DG Chest Portable 1 View  Result Date: 12/24/2019 CLINICAL DATA:  Dyspnea, cough, COVID-19 positive 12/04/2019 EXAM: PORTABLE CHEST 1 VIEW COMPARISON:  12/18/2019 FINDINGS: The heart size and mediastinal contours are within normal limits. Both lungs are clear. The visualized skeletal structures are unremarkable. IMPRESSION: No active disease. Electronically Signed   By: Sharlet Salina M.D.   On: 12/24/2019 23:37     Assessment & Plan:   History of COVID-19 Cough Shortness of breath Amenorrhea :  Will check pregnancy test - results negative - will order serology: - please follow up with GYN  Patient walked in office today - O2 sats remained above 99% for entire walk - heart rate slightly tachycardic   Will order d-dimer - shortness of breath and history of covid  Stay well hydrated  Stay active  Deep breathing exercises  May start vitamin C 2,000 mg daily, vitamin D3 2,000 IU daily, Zinc 220 mg daily, and Quercetin 500 mg twice daily  May take tylenol or fever or pain  May take mucinex DM twice daily    Follow up:  Follow up in 2 weeks or sooner if needed       Ivonne Andrew, NP 12/27/2019

## 2019-12-27 NOTE — Assessment & Plan Note (Signed)
Cough Shortness of breath Amenorrhea :  Will check pregnancy test - results negative - will order serology: - please follow up with GYN  Patient walked in office today - O2 sats remained above 99% for entire walk - heart rate slightly tachycardic   Will order d-dimer - shortness of breath and history of covid  Stay well hydrated  Stay active  Deep breathing exercises  May start vitamin C 2,000 mg daily, vitamin D3 2,000 IU daily, Zinc 220 mg daily, and Quercetin 500 mg twice daily  May take tylenol or fever or pain  May take mucinex DM twice daily    Follow up:  Follow up in 2 weeks or sooner if needed

## 2019-12-27 NOTE — Patient Instructions (Addendum)
Covid 19 Cough Shortness of breath Amenorrhea :  Will check pregnancy test - results negative - will order serology: - please follow up with GYN  Patient walked in office today - O2 sats remained above 99% for entire walk - heart rate slightly tachycardic   Will order d-dimer - shortness of breath and history of covid  Stay well hydrated  Stay active  Deep breathing exercises  May start vitamin C 2,000 mg daily, vitamin D3 2,000 IU daily, Zinc 220 mg daily, and Quercetin 500 mg twice daily  May take tylenol or fever or pain  May take mucinex DM twice daily    Follow up:  Follow up in 2 weeks or sooner if needed

## 2019-12-28 LAB — D-DIMER, QUANTITATIVE: D-DIMER: 0.31 mg/L FEU (ref 0.00–0.49)

## 2019-12-28 LAB — HCG, SERUM, QUALITATIVE: hCG,Beta Subunit,Qual,Serum: NEGATIVE m[IU]/mL (ref <6)

## 2019-12-31 ENCOUNTER — Telehealth: Payer: Self-pay | Admitting: Nurse Practitioner

## 2019-12-31 ENCOUNTER — Telehealth: Payer: Self-pay

## 2019-12-31 NOTE — Telephone Encounter (Signed)
Spoke with patient. She stated she just wanted to make you aware she is fine with waiting until 9/17 to come see you. Advised to call the office if anything changes or is she wants an earlier appointment.

## 2019-12-31 NOTE — Telephone Encounter (Signed)
-----   Message from Ivonne Andrew, NP sent at 12/31/2019  7:55 AM EDT ----- Please call to let patient know that her d-dimer was normal and her pregnancy test was negative. Hope she is feeling better.

## 2019-12-31 NOTE — Telephone Encounter (Signed)
Please call patient to make a follow up visit if she doesn't already have one scheduled. She called and stated that she is still having some issues.

## 2019-12-31 NOTE — Telephone Encounter (Signed)
Pt called checking status of forms for work. Advised forms should be ready Thursday 01/02/20. Pt request message to provider stating she is not feeling well & having some numbness in fingers as well. Pt phone [phone].

## 2019-12-31 NOTE — Telephone Encounter (Signed)
Results given to patient. Verbally understood. No additional questions

## 2020-01-02 ENCOUNTER — Telehealth: Payer: Self-pay

## 2020-01-02 NOTE — Telephone Encounter (Signed)
Pt already has virtual appt with Dr Alphonsus Sias on 01/03/20 at 7:30.

## 2020-01-02 NOTE — Telephone Encounter (Signed)
Valerie Mccarthy Will discuss her symptoms and plan tomorrow

## 2020-01-02 NOTE — Telephone Encounter (Signed)
[facility] - Client TELEPHONE ADVICE RECORD [facility] Patient Name: Valerie Mccarthy Gender: Female DOB: 05-26-1990 Age: 29 Y 1 M 8 D Return Phone Number: [phone] (Primary) Address: City/State/Zip: [place] [address] Client [facility] - Client Client Site [facility] - Day Physician Tillman Abide- MD Contact Type Call Who Is Calling Patient / Member / Family / Caregiver Call Type Triage / Clinical Relationship To Patient Self Return Phone Number [phone] (Primary) Chief Complaint Numbness Reason for Call Symptomatic / Request for Health Information Initial Comment Caller states, pt had covid 19 symptoms for a while. Pt having numbness in fingers and hands. Pt feels nausea, headaches, and fatigue. Translation No Nurse Assessment Nurse: Suezanne Jacquet, RN, Riley Lam Date/Time (Eastern Time): 01/02/2020 1:23:59 PM Confirm and document reason for call. If symptomatic, describe symptoms. ---Caller states, pt had covid 19 symptoms for a while. Pt having numbness in fingers and hands. Pt feels nausea, headaches, and fatigue. Numbness Started this week Positive covid Aug 11th then had another test later that was negative. Has the patient had close contact with a person known or suspected to have the novel coronavirus illness OR traveled / lives in area with major community spread (including international travel) in the last 14 days from the onset of symptoms? * If Asymptomatic, screen for exposure and travel within the last 14 days. ---No Does the patient have any new or worsening symptoms? ---Yes Will a triage be completed? ---Yes Related visit to physician within the last 2 weeks? ---No Does the PT have any chronic conditions? (i.e. diabetes, asthma, this includes High risk factors for pregnancy, etc.) ---No Is the patient pregnant or possibly pregnant? (Ask all females between the ages of  72-55) ---No Is this a behavioral health or substance abuse call? ---No Guidelines Guideline Title Affirmed Question Affirmed Notes Nurse Date/Time (Eastern Time) Hand and Wrist Pain [1] Weakness or numbness in hand or fingers AND [2] present > 2 weeks Camillia Herter 01/02/2020 1:26:37 PM PLEASE NOTE: All timestamps contained within this report are represented as [place] Standard Time. CONFIDENTIALTY NOTICE: This fax transmission is intended only for the addressee. It contains information that is legally privileged, confidential or otherwise protected from use or disclosure. If you are not the intended recipient, you are strictly prohibited from reviewing, disclosing, copying using or disseminating any of this information or taking any action in reliance on or regarding this information. If you have received this fax in error, please notify us immediately by telephone so that we can arrange for its return to [place]. Phone: [phone], Toll-Free: [phone], Fax: [phone] Page: 2 of 2 Call Id: [id number] Disp. Time Lamount Cohen Time) Disposition Final User 01/02/2020 1:30:50 PM SEE PCP WITHIN 3 DAYS Yes Suezanne Jacquet, RN, [place] Spaniel Disagree/Comply Comply Caller Understands Yes PreDisposition Did not know what to do Care Advice Given Per Guideline SEE PCP WITHIN 3 DAYS: CALL BACK IF: * Signs of infection occur (e.g., spreading redness, warmth, fever) * You become worse CARE ADVICE given per Hand and Wrist Pain (Adult) guideline. Comments User: Cameron Proud, RN Date/Time Lamount Cohen Time): 01/02/2020 1:31:43 PM Has virtual appointment for tomorrow. Referrals REFERRED TO PCP OFFICE

## 2020-01-03 ENCOUNTER — Other Ambulatory Visit: Payer: Self-pay

## 2020-01-03 ENCOUNTER — Telehealth (INDEPENDENT_AMBULATORY_CARE_PROVIDER_SITE_OTHER): Payer: BC Managed Care – PPO | Admitting: Internal Medicine

## 2020-01-03 ENCOUNTER — Encounter: Payer: Self-pay | Admitting: Internal Medicine

## 2020-01-03 ENCOUNTER — Telehealth: Payer: BC Managed Care – PPO | Admitting: Internal Medicine

## 2020-01-03 DIAGNOSIS — Z8616 Personal history of COVID-19: Secondary | ICD-10-CM | POA: Diagnosis not present

## 2020-01-03 NOTE — Assessment & Plan Note (Signed)
Has some classic symptoms of post COVID syndrome---mostly neurologic (headache, numbness) Did sort of freak out when she had the SOB while at work---hasn't gone back  Validated what she is feeling---not unusual for post COVID Discussed trying for remote work, part time---or FMLA to cover her if she goes back but needs to leave work Discussed adequate sleep, exercise (especially outside), eating right Reassured about the missed period--not surprising (and she is okay with becoming pregnant should that happen--so not on birth control)

## 2020-01-03 NOTE — Progress Notes (Signed)
Subjective:    Patient ID: Valerie Mccarthy, female    DOB: 1991/03/07, 29 y.o.   MRN: [id number]  HPI Video virtual visit for evaluation of sensory symptoms Identification done Reviewed limitations and billing and she gave consent Participants---patient in her home and I am in my office  Still having nausea, migraines and body aches Now has numbness in arms---this goes "in and out" Notes no problems just at rest but when touches something, she can tell that she doesn't fell correctly Some burning in arms up to elbows Will ease up --but then come back No problems with sensation in legs, trunk or head Some feeling of lightheadedness  Diagnosed with COVID 1 month ago Just "like a sinus" Just slept a lot Retested again 1 week later--it was negative Then with nausea, worse headache, SOB Had gone back to work (call center)---and 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 albuterol (VENTOLIN HFA) 108 (90 Base) MCG/ACT inhaler Inhale 2-4 puffs by mouth every 4 hours as needed for wheezing, cough, and/or shortness of breath 6.7 g 0  . HYDROcodone-Chlorpheniramine 5-4 MG/5ML SOLN Take 5 mLs by mouth every 6 (six) hours as needed. 473 mL 0  . ondansetron (ZOFRAN ODT) 4 MG disintegrating tablet Allow 1-2 tablets to dissolve in your mouth every 8 hours as needed for nausea/vomiting 30 tablet 0  . sertraline (ZOLOFT) 50 MG tablet Take 1 tablet (50 mg total) by mouth daily. 30 tablet 3  . Vitamin D, Ergocalciferol, (DRISDOL) 50000 units CAPS capsule Take 1 capsule (50,000 Units total) by mouth every 7 (seven) days. 12 capsule 3   No current facility-administered medications on file prior to visit.    No Known Allergies  Past Medical History:  Diagnosis Date  . COVID-19   . Depression     History reviewed. No pertinent surgical  history.  Family History  Problem Relation Age of Onset  . Obesity Mother   . Diabetes Mother   . Arthritis Mother   . Lupus Mother   . Heart disease Father     Social History   Socioeconomic History  . Marital status: Single    Spouse name: Not on file  . Number of children: Not on file  . Years of education: Not on file  . Highest education level: Not on file  Occupational History  . Not on file  Tobacco Use  . Smoking status: Light Tobacco Smoker    Packs/day: 0.25  . Smokeless tobacco: Never Used  Substance and Sexual Activity  . Alcohol use: Yes    Comment: 2-3 weekly   . Drug use: Never  . Sexual activity: Yes    Partners: Male    Birth control/protection: None  Other Topics Concern  . Not on file  Social History Narrative  . Not on file   Social Determinants of Health   Financial Resource Strain:   . Difficulty of Paying Living Expenses: Not on file  Food Insecurity:   . Worried About Programme researcher, broadcasting/film/video in the Last Year: Not on file  . Ran Out of Food in the Last Year: Not on file  Transportation Needs:   . Lack of Transportation (Medical): Not on file  . Lack of Transportation (Non-Medical): Not on file  Physical Activity:   . Days of Exercise per Week: Not on file  .  Minutes of Exercise per Session: Not on file  Stress:   . Feeling of Stress : Not on file  Social Connections:   . Frequency of Communication with Friends and Family: Not on file  . Frequency of Social Gatherings with Friends and Family: Not on file  . Attends Religious Services: Not on file  . Active Member of Clubs or Organizations: Not on file  . Attends Banker Meetings: Not on file  . Marital Status: Not on file  Intimate Partner Violence:   . Fear of Current or Ex-Partner: Not on file  . Emotionally Abused: Not on file  . Physically Abused: Not on file  . Sexually Abused: Not on file    Review of Systems Hasn't been going to work---very stressful and hasn't  been able to deal with this (tends to worsen her depression also) Still hasn't had a period since her illness    Objective:   Physical Exam Constitutional:      Appearance: Normal appearance.  Neurological:     Mental Status: She is alert.     Comments: Normal speech and interaction            Assessment & Plan:

## 2020-01-07 NOTE — Telephone Encounter (Signed)
Spoke with pt First day out 12/22/19 to 02/05/20 Due to covid and side effects Number in hands

## 2020-01-07 NOTE — Telephone Encounter (Signed)
Valerie Mccarthy needs you to contact her in ref to her paperwork for short term disability.  Could please return her call at [phone].  Thank you!

## 2020-01-10 ENCOUNTER — Ambulatory Visit: Payer: BC Managed Care – PPO

## 2020-01-10 ENCOUNTER — Other Ambulatory Visit: Payer: Self-pay

## 2020-01-10 ENCOUNTER — Telehealth (INDEPENDENT_AMBULATORY_CARE_PROVIDER_SITE_OTHER): Payer: BC Managed Care – PPO | Admitting: Nurse Practitioner

## 2020-01-10 DIAGNOSIS — R0602 Shortness of breath: Secondary | ICD-10-CM | POA: Diagnosis not present

## 2020-01-10 DIAGNOSIS — Z8616 Personal history of COVID-19: Secondary | ICD-10-CM | POA: Diagnosis not present

## 2020-01-10 DIAGNOSIS — N912 Amenorrhea, unspecified: Secondary | ICD-10-CM | POA: Diagnosis not present

## 2020-01-10 NOTE — Telephone Encounter (Signed)
Spoke with Valerie Mccarthy.  She wanted paperwork deferred to [facility].  I called pt to let her know.  Pt stated dr Alphonsus Sias told her and her last appointment with him.   he would fill out paperwork.  Pt is aware dr Alphonsus Sias is out of office till tuesday

## 2020-01-10 NOTE — Progress Notes (Signed)
  Virtual Visit via Telephone Note  I connected with Valerie Mccarthy on 01/13/20 at 11:15 AM EDT by telephone and verified that I am speaking with the correct person using two identifiers.  Location: Patient: home Provider: office   I discussed the limitations, risks, security and privacy concerns of performing an evaluation and management service by telephone and the availability of in person appointments. I also discussed with the patient that there may be a patient responsible charge related to this service. The patient expressed understanding and agreed to proceed.   History of Present Illness:  Patient presents today for post covid care clinic visit follow up.  Patient complains of ongoing numbness and tingling to bilateral hands and legs, headaches, fatigue. Lab work and chest x ray at last visit were normal. Patient has been inactive since diagnosed with covid. Denies f/c/s, n/v/d, hemoptysis, PND, chest pain or edema.     Observations/Objective:  Vitals with BMI 12/27/2019 12/25/2019 12/24/2019  Height 5\' 2"  - -  Weight 326 lbs - -  BMI 59.61 - -  Systolic 128 148  Diastolic 72 81 76  Pulse 70 75 73  Some encounter information is confidential and restricted. Go to Review Flowsheets activity to see all data.      Assessment and Plan:  History of Covid Fatigue Headaches Numbness and tingling to bilateral hands and legs:  Will place referral to neurology  Stay active  Stay well hydrated  Deep breathing exercises  May start vitamin C 2,000 mg daily, vitamin D3 2,000 IU daily, Zinc 220 mg daily, and Quercetin 500 mg twice daily  May take tylenol or fever or pain   Physical deconditioning:  Will place referral to neuro rehab   Follow Up Instructions:   Follow up after seen by neurology     I discussed the assessment and treatment plan with the patient. The patient was provided an opportunity to ask questions and all were answered. The patient agreed with  the plan and demonstrated an understanding of the instructions.   The patient was advised to call back or seek an in-person evaluation if the symptoms worsen or if the condition fails to improve as anticipated.  I provided 23 minutes of non-face-to-face time during this encounter.   676, NP

## 2020-01-10 NOTE — Patient Instructions (Addendum)
History of Covid Fatigue Headaches Numbness and tingling to bilateral hands and legs:  Will place referral to neurology  Stay active  Stay well hydrated  Deep breathing exercises  May start vitamin C 2,000 mg daily, vitamin D3 2,000 IU daily, Zinc 220 mg daily, and Quercetin 500 mg twice daily  May take tylenol or fever or pain   Physical deconditioning:  Will place referral to neuro rehab  Follow up:  Follow up after seen by neurology

## 2020-01-14 NOTE — Telephone Encounter (Signed)
Paperwork in dr Karle Starch in [employer] with pt she stated when she had her appointment with dr Alphonsus Sias he stated he would fill out paperwork

## 2020-01-14 NOTE — Telephone Encounter (Signed)
I did agree to do her paperwork but we had discussed intermittent time off if she became overwhelmed--not a prolonged absence

## 2020-01-14 NOTE — Telephone Encounter (Signed)
Spoke with pt she stated she is going to need time off.  She stated she cannot drive because her hand goes numb. Still has all the symptoms she is still talking to [facility] and they told her they were going to refer her to neurology but has not heard back with an appointment yet.  Paperwork back in dr Vassie Moselle in box

## 2020-01-15 NOTE — Telephone Encounter (Signed)
Okay to send for the block of time requested

## 2020-01-22 NOTE — Telephone Encounter (Signed)
Copy for pt °Copy for scan °

## 2020-01-28 ENCOUNTER — Encounter: Payer: Self-pay | Admitting: Family Medicine

## 2020-01-30 ENCOUNTER — Other Ambulatory Visit: Payer: Self-pay | Admitting: Family Medicine

## 2020-01-30 DIAGNOSIS — R7303 Prediabetes: Secondary | ICD-10-CM

## 2020-02-07 ENCOUNTER — Other Ambulatory Visit (INDEPENDENT_AMBULATORY_CARE_PROVIDER_SITE_OTHER): Payer: Self-pay

## 2020-02-07 ENCOUNTER — Other Ambulatory Visit: Payer: Self-pay

## 2020-02-07 DIAGNOSIS — R7303 Prediabetes: Secondary | ICD-10-CM

## 2020-02-07 LAB — LIPID PANEL
Cholesterol: 177 mg/dL (ref 0–200)
HDL: 45.8 mg/dL (ref >39.00)
LDL Cholesterol: 111 mg/dL — ABNORMAL HIGH (ref 0–99)
NonHDL: 131.48
Total CHOL/HDL Ratio: 4
Triglycerides: 102 mg/dL (ref 0.0–149.0)
VLDL: 20.4 mg/dL (ref 0.0–40.0)

## 2020-02-07 LAB — HEMOGLOBIN A1C: Hgb A1c MFr Bld: 6.3 % (ref 4.6–6.5)

## 2020-02-12 ENCOUNTER — Other Ambulatory Visit: Payer: Self-pay

## 2020-02-12 ENCOUNTER — Ambulatory Visit (INDEPENDENT_AMBULATORY_CARE_PROVIDER_SITE_OTHER): Payer: Self-pay | Admitting: Family Medicine

## 2020-02-12 ENCOUNTER — Other Ambulatory Visit (HOSPITAL_COMMUNITY)
Admission: RE | Admit: 2020-02-12 | Discharge: 2020-02-12 | Disposition: A | Discharge status: Home / Self Care | Payer: Self-pay | Source: Ambulatory Visit | Attending: Family Medicine | Admitting: Family Medicine

## 2020-02-12 ENCOUNTER — Encounter: Payer: Self-pay | Admitting: Family Medicine

## 2020-02-12 ENCOUNTER — Other Ambulatory Visit: Payer: Self-pay | Admitting: Family Medicine

## 2020-02-12 VITALS — BP 110/68 | HR 79 | Temp 97.5°F | Ht 62.0 in | Wt 323.5 lb

## 2020-02-12 DIAGNOSIS — R0602 Shortness of breath: Secondary | ICD-10-CM

## 2020-02-12 DIAGNOSIS — Z8616 Personal history of COVID-19: Secondary | ICD-10-CM

## 2020-02-12 DIAGNOSIS — F331 Major depressive disorder, recurrent, moderate: Secondary | ICD-10-CM

## 2020-02-12 DIAGNOSIS — M545 Low back pain, unspecified: Secondary | ICD-10-CM

## 2020-02-12 DIAGNOSIS — Z0000 Encounter for general adult medical examination without abnormal findings: Secondary | ICD-10-CM

## 2020-02-12 DIAGNOSIS — Z124 Encounter for screening for malignant neoplasm of cervix: Secondary | ICD-10-CM

## 2020-02-12 DIAGNOSIS — G8929 Other chronic pain: Secondary | ICD-10-CM

## 2020-02-12 DIAGNOSIS — E559 Vitamin D deficiency, unspecified: Secondary | ICD-10-CM

## 2020-02-12 MED ORDER — VITAMIN D (ERGOCALCIFEROL) 1.25 MG (50000 UNIT) PO CAPS: 50000.0000 [IU] | ORAL_CAPSULE | ORAL | 3 refills | Status: DC

## 2020-02-12 MED ORDER — BUPROPION HCL ER (XL) 150 MG PO TB24: 150.0000 mg | ORAL_TABLET | Freq: Every day | ORAL | 3 refills | Status: DC

## 2020-02-12 NOTE — Patient Instructions (Signed)
Good to see you today  I have sent in a new medication for depression/ mood. You can start it right away, take in the morning. Wean your sertraline, take 1/2 tablet for 7 days then stop.   Follow up with me in 8 weeks, can be a virtual visit, follow up sooner if needed  Continue to do some walking, watch your diet, drink enough liquids and have a regular sleep/ wake scheduled  For your back pain, try back exercises, tylenol, heat, massage   Back Exercises These exercises help to make your trunk and back strong. They also help to keep the lower back flexible. Doing these exercises can help to prevent back pain or lessen existing pain.  If you have back pain, try to do these exercises 2-3 times each day or as told by your doctor.  As you get better, do the exercises once each day. Repeat the exercises more often as told by your doctor.  To stop back pain from coming back, do the exercises once each day, or as told by your doctor. Exercises Single knee to chest Do these steps 3-5 times in a row for each leg: 1. Lie on your back on a firm bed or the floor with your legs stretched out. 2. Bring one knee to your chest. 3. Grab your knee or thigh with both hands and hold them it in place. 4. Pull on your knee until you feel a gentle stretch in your lower back or buttocks. 5. Keep doing the stretch for 10-30 seconds. 6. Slowly let go of your leg and straighten it. Pelvic tilt Do these steps 5-10 times in a row: 1. Lie on your back on a firm bed or the floor with your legs stretched out. 2. Bend your knees so they point up to the ceiling. Your feet should be flat on the floor. 3. Tighten your lower belly (abdomen) muscles to press your lower back against the floor. This will make your tailbone point up to the ceiling instead of pointing down to your feet or the floor. 4. Stay in this position for 5-10 seconds while you gently tighten your muscles and breathe evenly. Cat-cow Do these steps  until your lower back bends more easily: 1. Get on your hands and knees on a firm surface. Keep your hands under your shoulders, and keep your knees under your hips. You may put padding under your knees. 2. Let your head hang down toward your chest. Tighten (contract) the muscles in your belly. Point your tailbone toward the floor so your lower back becomes rounded like the back of a cat. 3. Stay in this position for 5 seconds. 4. Slowly lift your head. Let the muscles of your belly relax. Point your tailbone up toward the ceiling so your back forms a sagging arch like the back of a cow. 5. Stay in this position for 5 seconds.  Press-ups Do these steps 5-10 times in a row: 1. Lie on your belly (face-down) on the floor. 2. Place your hands near your head, about shoulder-width apart. 3. While you keep your back relaxed and keep your hips on the floor, slowly straighten your arms to raise the top half of your body and lift your shoulders. Do not use your back muscles. You may change where you place your hands in order to make yourself more comfortable. 4. Stay in this position for 5 seconds. 5. Slowly return to lying flat on the floor.  Bridges Do these steps 10  times in a row: 1. Lie on your back on a firm surface. 2. Bend your knees so they point up to the ceiling. Your feet should be flat on the floor. Your arms should be flat at your sides, next to your body. 3. Tighten your butt muscles and lift your butt off the floor until your waist is almost as high as your knees. If you do not feel the muscles working in your butt and the back of your thighs, slide your feet 1-2 inches farther away from your butt. 4. Stay in this position for 3-5 seconds. 5. Slowly lower your butt to the floor, and let your butt muscles relax. If this exercise is too easy, try doing it with your arms crossed over your chest. Belly crunches Do these steps 5-10 times in a row: 1. Lie on your back on a firm bed or the  floor with your legs stretched out. 2. Bend your knees so they point up to the ceiling. Your feet should be flat on the floor. 3. Cross your arms over your chest. 4. Tip your chin a little bit toward your chest but do not bend your neck. 5. Tighten your belly muscles and slowly raise your chest just enough to lift your shoulder blades a tiny bit off of the floor. Avoid raising your body higher than that, because it can put too much stress on your low back. 6. Slowly lower your chest and your head to the floor. Back lifts Do these steps 5-10 times in a row: 1. Lie on your belly (face-down) with your arms at your sides, and rest your forehead on the floor. 2. Tighten the muscles in your legs and your butt. 3. Slowly lift your chest off of the floor while you keep your hips on the floor. Keep the back of your head in line with the curve in your back. Look at the floor while you do this. 4. Stay in this position for 3-5 seconds. 5. Slowly lower your chest and your face to the floor. Contact a doctor if:  Your back pain gets a lot worse when you do an exercise.  Your back pain does not get better 2 hours after you exercise. If you have any of these problems, stop doing the exercises. Do not do them again unless your doctor says it is okay. Get help right away if:  You have sudden, very bad back pain. If this happens, stop doing the exercises. Do not do them again unless your doctor says it is okay. This information is not intended to replace advice given to you by your health care provider. Make sure you discuss any questions you have with your health care provider. Document Revised: 01/04/2018 Document Reviewed: 01/04/2018 Elsevier Patient Education  2020 [employer].

## 2020-02-12 NOTE — Progress Notes (Signed)
Subjective:    Patient ID: Valerie Mccarthy, female    DOB: 09-13-1990, 29 y.o.   MRN: [id number]  HPI Chief Complaint  Patient presents with   Annual Exam    w/ pap    This is a 29 yo female who presents today for annual exam.     Last CPE- 06/06/2018 Pap-several years ago Tdap-May 10, 2016 Flu-declines Eye-within the last 2 years Dental-regular Exercise-not regular  Had Covid about 2 months ago, still will brain fog, fatigue, using inhaler 2-3 times a day. Went back to work earlier this week. Had a difficult time. Body burning. Has days that she struggles to get out of bed.  Has followed up in the post Covid clinic.  Was told that it would take a long time for symptoms to improve and they may never completely resolve.  Depression- taking sertraline 50 mg, just makes her sleepy/ dazed. Takes at night. Sleepy but difficulty with sleep.  Struggled with mood, worse with Covid infection and post Covid symptoms.  Obesity-she and her boyfriend have been trying to make healthier food choices.  Smoking-she continues to smoke.  Review of Systems  Constitutional: Positive for fatigue.  HENT: Negative.   Eyes: Negative.   Respiratory: Positive for cough, chest tightness and shortness of breath.   Cardiovascular: Positive for chest pain (one episode right side of chest, resolved sponateoulsy after a couple of minutes. ) and palpitations.  Gastrointestinal: Negative.   Endocrine: Negative.   Genitourinary: Negative.   Musculoskeletal: Positive for back pain (chronic, no sciatica) and myalgias.  Skin: Negative.   Allergic/Immunologic: Negative.   Neurological: Negative for headaches.  Hematological: Negative.   Psychiatric/Behavioral: Positive for decreased concentration, dysphoric mood and sleep disturbance.       Objective:   Physical Exam Physical Exam  Constitutional: She is oriented to person, place, and time. She appears well-developed and well-nourished. No distress.   Obese. HENT:  Head: Normocephalic and atraumatic.  Right Ear: External ear normal. TM normal.  Left Ear: External ear normal. TM normal.  Nose: Nose normal.  Mouth/Throat: Oropharynx is clear and moist. No oropharyngeal exudate.  Eyes: Conjunctivae are normal.   Neck: Normal range of motion. Neck supple. No JVD present. No thyromegaly present.  Cardiovascular: Normal rate, regular rhythm, normal heart sounds and intact distal pulses.   Pulmonary/Chest: Effort normal and breath sounds normal. Right breast exhibits no inverted nipple, no mass, no nipple discharge, no skin change and no tenderness. Left breast exhibits no inverted nipple, no mass, no nipple discharge, no skin change and no tenderness. Breasts are symmetrical.  Abdominal: Soft. Bowel sounds are normal. She exhibits no distension and no mass. There is no tenderness. There is no rebound and no guarding.  Genitourinary: Vagina normal. Pelvic exam was performed with patient supine. There is no rash, tenderness, lesion or injury on the right labia. There is no rash, tenderness, lesion or injury on the left labia. Cervix exhibits no motion tenderness and no discharge. No vaginal discharge found.  Musculoskeletal: Normal range of motion. She exhibits no edema or tenderness.  Lymphadenopathy:    She has no cervical adenopathy.  Neurological: She is alert and oriented to person, place, and time.   Skin: Skin is warm and dry. She is not diaphoretic.  Psychiatric: She has a normal mood and affect. Her behavior is normal. Judgment and thought content normal.  Vitals reviewed.    BP 110/68    Pulse 79    Temp (!) 97.5 F (  36.4 C) (Temporal)    Ht 5\' 2"  (1.575 m)    Wt (!) 323 lb 8 oz (146.7 kg)    LMP 01/18/2020 (Exact Date)    SpO2 99%    BMI 59.17 kg/m  Wt Readings from Last 3 Encounters:  02/12/20 (!) 323 lb 8 oz (146.7 kg)  12/27/19 (!) 326 lb (147.9 kg)  12/24/19 (!) 315 lb (142.9 kg)   Depression screen [facility] 2/9 02/12/2020  11/08/2019 10/11/2019 10/22/2018 06/06/2018  Decreased Interest 1 0 1 1 0  Down, Depressed, Hopeless 1 0 1 1 1   PHQ - 2 Score 2 0 2 2 1   Altered sleeping 1 0 1 1 -  Tired, decreased energy 1 1 1 1  -  Change in appetite 0 0 1 1 -  Feeling bad or failure about yourself  1 0 3 1 -  Trouble concentrating 1 0 3 0 -  Moving slowly or fidgety/restless 0 0 0 0 -  Suicidal thoughts 0 0 0 0 -  PHQ-9 Score 6 1 11 6  -  Difficult doing work/chores Extremely dIfficult Not difficult at all Very difficult - -        Assessment & Plan:  1. Annual physical exam - Discussed and encouraged healthy lifestyle choices- adequate sleep, regular exercise, stress management and healthy food choices.    2. Moderate recurrent major depression (HCC) -We will have her wean her sertraline and start bupropion -Provided information for counseling -Encouraged her to eat generally whole, unprocessed foods, take a walk daily, stick to a regular bedtime -Follow-up in 8 weeks, sooner if worsening symptoms - buPROPion (WELLBUTRIN XL) 150 MG 24 hr tablet; Take 1 tablet (150 mg total) by mouth daily.  Dispense: 30 tablet; Refill: 3  3. Screening for cervical cancer - Cytology - PAP([place])  4. Morbid obesity (HCC) -Encouraged healthy food choices, regular activity, good water intake  5. History of COVID-19 -Continues to be symptomatic, discussed unknown course of symptoms, encouraged appropriate self-care  6. Shortness of breath -Albuterol as needed, increase activity as tolerated, weight loss  7. Chronic midline low back pain without sciatica -Discussed over-the-counter analgesics and provided her with some back exercises  This visit occurred during the SARS-CoV-2 public health emergency.  Safety protocols were in place, including screening questions prior to the visit, additional usage of staff PPE, and extensive cleaning of exam room while observing appropriate contact time as indicated for disinfecting  solutions.      08/05/2018, FNP-BC  [facility]  02/12/2020 5:31 PM

## 2020-02-14 LAB — CYTOLOGY - PAP
Chlamydia: NEGATIVE
Comment: NEGATIVE
Comment: NEGATIVE
Comment: NEGATIVE
Comment: NORMAL
Diagnosis: NEGATIVE
High risk HPV: NEGATIVE
Neisseria Gonorrhea: NEGATIVE
Trichomonas: NEGATIVE

## 2020-02-26 ENCOUNTER — Telehealth: Payer: Self-pay | Admitting: Family Medicine

## 2020-02-26 NOTE — Telephone Encounter (Signed)
Paperwork completed and placed on your desk.

## 2020-02-26 NOTE — Telephone Encounter (Signed)
Paperwork in debbies in box 

## 2020-02-27 DIAGNOSIS — M940 Chondrocostal junction syndrome [Tietze]: Secondary | ICD-10-CM | POA: Diagnosis not present

## 2020-02-28 NOTE — Telephone Encounter (Signed)
Paperwork faxed °

## 2020-03-03 ENCOUNTER — Telehealth: Payer: Self-pay | Admitting: Family Medicine

## 2020-03-03 NOTE — Telephone Encounter (Signed)
Pt called giving permission to speak with Valerie Mccarthy in reference to her recommendation packet.

## 2020-03-03 NOTE — Telephone Encounter (Signed)
TERRICA CALLED BACK BUT COULDN'T HEAR THE VM IT STATED NOT PLAYABLE  UNSUPPORTED

## 2020-03-03 NOTE — Telephone Encounter (Signed)
Called pt left detail msg needing permission to talk to [name]. regarding the packet sent to pt.

## 2020-03-03 NOTE — Telephone Encounter (Signed)
[name] called and wanted to go over the recommendation packet that was sent for Valerie Mccarthy.

## 2020-03-03 NOTE — Telephone Encounter (Signed)
Called Trilby Leaver and lvm. Made copy of packet and original given back to Robin.

## 2020-03-04 ENCOUNTER — Encounter: Payer: Self-pay | Admitting: Family Medicine

## 2020-03-04 NOTE — Telephone Encounter (Signed)
Valerie Mccarthy returned your call Best number [phone]

## 2020-03-04 NOTE — Telephone Encounter (Signed)
Called  Valerie Mccarthy, lvm

## 2020-03-04 NOTE — Telephone Encounter (Signed)
Joellen spoke to [place], updated the Job Accomodation Request ppw and I faxed to [employer]. Called Terrica and lvm.

## 2020-03-04 NOTE — Telephone Encounter (Signed)
Hi Debbie, I spoke with Dyke Maes from [employer] HR depatment on behalf of [employer]. Junius [place]s states that you did not provide a denial or approval for these dates Oct 19,21,22,25, and 26.  She also need to know will Alexa need a follow up visit? Will she need intermediate days going forward? When  do the dates begin and end?  Will she need to be reaccess?

## 2020-03-10 NOTE — Telephone Encounter (Signed)
Copy for scan  Valerie Mccarthy spoke with pt regarding this

## 2020-04-10 ENCOUNTER — Ambulatory Visit: Payer: Self-pay | Admitting: Family Medicine

## 2020-04-13 ENCOUNTER — Ambulatory Visit: Payer: Self-pay | Admitting: Family Medicine

## 2020-04-13 DIAGNOSIS — Z0289 Encounter for other administrative examinations: Secondary | ICD-10-CM

## 2020-04-29 DIAGNOSIS — B9689 Other specified bacterial agents as the cause of diseases classified elsewhere: Secondary | ICD-10-CM

## 2020-04-30 MED ORDER — METRONIDAZOLE 0.75 % VA GEL: 1.0000 | Freq: Every day | VAGINAL | 0 refills | Status: AC

## 2020-05-28 ENCOUNTER — Ambulatory Visit: Payer: Self-pay | Admitting: Family Medicine

## 2020-05-29 ENCOUNTER — Other Ambulatory Visit: Payer: Self-pay

## 2020-05-29 ENCOUNTER — Ambulatory Visit
Admission: RE | Admit: 2020-05-29 | Discharge: 2020-05-29 | Disposition: A | Discharge status: Home / Self Care | Payer: Self-pay | Source: Ambulatory Visit

## 2020-06-02 ENCOUNTER — Ambulatory Visit: Payer: Self-pay | Admitting: Physician Assistant

## 2020-06-02 ENCOUNTER — Encounter: Payer: Self-pay | Admitting: Physician Assistant

## 2020-06-02 ENCOUNTER — Other Ambulatory Visit: Payer: Self-pay

## 2020-06-02 DIAGNOSIS — Z113 Encounter for screening for infections with a predominantly sexual mode of transmission: Secondary | ICD-10-CM

## 2020-06-02 DIAGNOSIS — Z299 Encounter for prophylactic measures, unspecified: Secondary | ICD-10-CM

## 2020-06-02 LAB — WET PREP FOR TRICH, YEAST, CLUE
Trichomonas Exam: NEGATIVE
Yeast Exam: NEGATIVE

## 2020-06-02 LAB — HIV ANTIBODY (ROUTINE TESTING W REFLEX): HIV 1&2 Ab, 4th Generation: NONREACTIVE

## 2020-06-02 MED ORDER — CLOTRIMAZOLE 1 % VA CREA: 1.0000 | TOPICAL_CREAM | Freq: Every day | VAGINAL | 0 refills | Status: AC

## 2020-06-02 NOTE — Progress Notes (Signed)
[facility] Department STI clinic/screening visit  Subjective:  Russie Julson is a 30 y.o. female being seen today for an STI screening visit. The patient reports they do have symptoms.  Patient reports that they do not desire a pregnancy in the next year.   They reported they are not interested in discussing contraception today.  No LMP recorded.   Patient has the following medical conditions:   Patient Active Problem List   Diagnosis Date Noted  . Amenorrhea 12/27/2019  . History of COVID-19 12/27/2019  . Shortness of breath 12/27/2019  . Morbid obesity (HCC) 10/22/2018  . Menorrhagia with regular cycle 10/22/2018  . Anxiety and depression 10/22/2018  . Moderate recurrent major depression (HCC) 11/22/2017    Chief Complaint  Patient presents with  . SEXUALLY TRANSMITTED DISEASE    Screening with bloodwork    HPI  Patient reports that she has had itching both internally and externally for a few days and some slight vaginal odor.  Denies other symptoms and surgeries.  States last HIV test was in 2021 and last pap was 2 months ago.  Reports that she is not using any hormonal BCM at this time.  See flowsheet for further details and programmatic requirements.    The following portions of the patient's history were reviewed and updated as appropriate: allergies, current medications, past medical history, past social history, past surgical history and problem list.  Objective:  There were no vitals filed for this visit.  Physical Exam Constitutional:      General: She is not in acute distress.    Appearance: Normal appearance.  HENT:     Head: Normocephalic and atraumatic.     Comments: No nits,lice, or hair loss. No cervical, supraclavicular or axillary adenopathy.    Mouth/Throat:     Mouth: Mucous membranes are moist.     Pharynx: Oropharynx is clear. No oropharyngeal exudate or posterior oropharyngeal erythema.  Eyes:     Conjunctiva/sclera: Conjunctivae  normal.  Pulmonary:     Effort: Pulmonary effort is normal.  Abdominal:     Palpations: Abdomen is soft. There is no mass.     Tenderness: There is no abdominal tenderness. There is no guarding or rebound.  Genitourinary:    General: Normal vulva.     Rectum: Normal.     Comments: External genitalia/pubic area without nits, lice, edema, erythema, lesions and inguinal adenopathy. Vagina with normal mucosa and discharge. Cervix without visible lesions. Uterus firm, mobile, nt, no masses, no CMT, no adnexal tenderness or fullness. Musculoskeletal:     Cervical back: Neck supple. No tenderness.  Skin:    General: Skin is warm and dry.     Findings: No bruising, erythema, lesion or rash.  Neurological:     Mental Status: She is alert and oriented to person, place, and time.  Psychiatric:        Mood and Affect: Mood normal.        Behavior: Behavior normal.        Thought Content: Thought content normal.        Judgment: Judgment normal.      Assessment and Plan:  Sophiarose Schlup is a 30 y.o. female presenting to the [facility] Department for STI screening  1. Screening for STD (sexually transmitted disease) Patient into clinic with symptoms. Rec condoms with all sex. Await test results.  Counseled that RN will call if needs to RTC for treatment once results are back. - WET PREP FOR TRICH,  YEAST, CLUE - Chlamydia/Gonorrhea [place] [facility] - [facility] - Syphilis Serology, [facility]  2. Prophylactic measure Will treat due to symptoms while awaiting other results with Clotrimazole 1 % vaginal cream 1 app qhs for 7 days. No sex for 10 days. - clotrimazole (GYNE-LOTRIMIN) 1 % vaginal cream; Place 1 Applicatorful vaginally at bedtime for 7 days.  Dispense: 45 g; Refill: 0     No follow-ups on file.  No future appointments.  Matt Holmes, PA

## 2020-06-02 NOTE — Progress Notes (Signed)
Wet mount reviewed by provider; dispensed clotrimazole 1% vaginal cream per provider orders. Provider orders completed.

## 2020-06-08 ENCOUNTER — Other Ambulatory Visit: Payer: Self-pay

## 2020-06-08 NOTE — Progress Notes (Signed)
RN abstracted non reactive HIV. Leona Alen, RN  

## 2020-06-10 ENCOUNTER — Telehealth: Payer: Self-pay | Admitting: Family Medicine

## 2020-06-10 NOTE — Telephone Encounter (Signed)
Phone call to pt. Pt confirmed password from last visit and then pt counseled about test results from 06/02/20 visit.

## 2020-06-10 NOTE — Telephone Encounter (Signed)
Please call me in ref to my test results

## 2020-09-09 ENCOUNTER — Inpatient Hospital Stay
Admission: RE | Admit: 2020-09-09 | Discharge: 2020-09-09 | Disposition: A | Discharge status: Home / Self Care | Payer: Self-pay | Source: Ambulatory Visit

## 2020-09-09 ENCOUNTER — Other Ambulatory Visit: Payer: Self-pay

## 2020-12-28 ENCOUNTER — Other Ambulatory Visit: Payer: Self-pay

## 2020-12-28 ENCOUNTER — Ambulatory Visit
Admission: RE | Admit: 2020-12-28 | Discharge: 2020-12-28 | Disposition: A | Discharge status: Home / Self Care | Payer: 59 | Source: Ambulatory Visit | Attending: Emergency Medicine | Admitting: Emergency Medicine

## 2020-12-28 VITALS — BP 124/85 | HR 98 | Temp 99.2°F

## 2020-12-28 DIAGNOSIS — B349 Viral infection, unspecified: Secondary | ICD-10-CM | POA: Insufficient documentation

## 2020-12-28 DIAGNOSIS — J029 Acute pharyngitis, unspecified: Secondary | ICD-10-CM | POA: Diagnosis present

## 2020-12-28 LAB — POCT RAPID STREP A (OFFICE): Rapid Strep A Screen: NEGATIVE

## 2020-12-28 NOTE — Discharge Instructions (Addendum)
Your rapid strep test is negative.  A throat culture is pending; we will call you if it is positive requiring treatment.    Your COVID test is pending.  You should self quarantine until the test result is back.    Take Tylenol or ibuprofen as needed for fever or discomfort.  Rest and keep yourself hydrated.    Follow-up with your primary care provider if your symptoms are not improving.     

## 2020-12-28 NOTE — ED Provider Notes (Signed)
Valerie Mccarthy    CSN: [id number] Arrival date & time: 12/28/20  1251      History   Chief Complaint Chief Complaint  Patient presents with   Sore Throat   Otalgia   APPT 1245    HPI Valerie Mccarthy is a 30 y.o. female.  Patient presents with bilateral ear pain, sore throat, chills since yesterday.  She denies fever, rash, cough, shortness of breath, vomiting, diarrhea, or other symptoms.  No treatments attempted at home.  Patient had COVID last fall.  No recent COVID test.  The history is provided by the patient and medical records.   Past Medical History:  Diagnosis Date   COVID-19    Depression     Patient Active Problem List   Diagnosis Date Noted   Amenorrhea 12/27/2019   History of COVID-19 12/27/2019   Shortness of breath 12/27/2019   Morbid obesity (HCC) 10/22/2018   Menorrhagia with regular cycle 10/22/2018   Anxiety and depression 10/22/2018   Moderate recurrent major depression (HCC) 11/22/2017    History reviewed. No pertinent surgical history.  OB History   No obstetric history on file.      Home Medications    Prior to Admission medications   Medication Sig Start Date End Date Taking? Authorizing Provider  albuterol (VENTOLIN HFA) 108 (90 Base) MCG/ACT inhaler Inhale 2-4 puffs by mouth every 4 hours as needed for wheezing, cough, and/or shortness of breath 12/24/19   Loleta Rose, MD  buPROPion (WELLBUTRIN XL) 150 MG 24 hr tablet Take 1 tablet (150 mg total) by mouth daily. 02/12/20   Emi Belfast, FNP  HYDROcodone-Chlorpheniramine 5-4 MG/5ML SOLN Take 5 mLs by mouth every 6 (six) hours as needed. Patient not taking: No sig reported 12/24/19   Loleta Rose, MD  ondansetron (ZOFRAN ODT) 4 MG disintegrating tablet Allow 1-2 tablets to dissolve in your mouth every 8 hours as needed for nausea/vomiting 12/24/19   Loleta Rose, MD  sertraline (ZOLOFT) 50 MG tablet Take 1 tablet (50 mg total) by mouth daily. 10/11/19   Emi Belfast, FNP   Vitamin D, Ergocalciferol, (DRISDOL) 1.25 MG (50000 UNIT) CAPS capsule Take 1 capsule (50,000 Units total) by mouth every 7 (seven) days. 02/12/20   Emi Belfast, FNP    Family History Family History  Problem Relation Age of Onset   Obesity Mother    Diabetes Mother    Arthritis Mother    Lupus Mother    Heart disease Father     Social History Social History   Tobacco Use   Smoking status: Light Smoker    Packs/day: 0.25    Types: Cigarettes   Smokeless tobacco: Never  Substance Use Topics   Alcohol use: Yes    Comment: 2-3 weekly    Drug use: Never     Allergies   Patient has no known allergies.   Review of Systems Review of Systems  Constitutional:  Positive for chills. Negative for fever.  HENT:  Positive for ear pain and sore throat.   Respiratory:  Negative for cough and shortness of breath.   Cardiovascular:  Negative for chest pain and palpitations.  Gastrointestinal:  Negative for abdominal pain, diarrhea and vomiting.  Skin:  Negative for color change and rash.  All other systems reviewed and are negative.   Physical Exam Triage Vital Signs ED Triage Vitals  Enc Vitals Group     BP      Pulse      Resp  Temp      Temp src      SpO2      Weight      Height      Head Circumference      Peak Flow      Pain Score      Pain Loc      Pain Edu?      Excl. in GC?    No data found.  Updated Vital Signs BP 124/85 (BP Location: Left Arm)   Pulse 98   Temp 99.2 F (37.3 C) (Oral)   LMP 11/27/2020 (Approximate)   SpO2 96%   Visual Acuity Right Eye Distance:   Left Eye Distance:   Bilateral Distance:    Right Eye Near:   Left Eye Near:    Bilateral Near:     Physical Exam Vitals and nursing note reviewed.  Constitutional:      General: She is not in acute distress.    Appearance: She is well-developed. She is obese.  HENT:     Head: Normocephalic and atraumatic.     Right Ear: Tympanic membrane normal.     Left Ear:  Tympanic membrane normal.     Nose: Nose normal.     Mouth/Throat:     Mouth: Mucous membranes are moist.     Pharynx: Posterior oropharyngeal erythema present.  Eyes:     Conjunctiva/sclera: Conjunctivae normal.  Cardiovascular:     Rate and Rhythm: Normal rate and regular rhythm.     Heart sounds: Normal heart sounds.  Pulmonary:     Effort: Pulmonary effort is normal. No respiratory distress.     Breath sounds: Normal breath sounds.  Abdominal:     Palpations: Abdomen is soft.     Tenderness: There is no abdominal tenderness.  Musculoskeletal:     Cervical back: Neck supple.  Skin:    General: Skin is warm and dry.  Neurological:     General: No focal deficit present.     Mental Status: She is alert and oriented to person, place, and time.     Gait: Gait normal.  Psychiatric:        Mood and Affect: Mood normal.        Behavior: Behavior normal.     UC Treatments / Results  Labs (all labs ordered are listed, but only abnormal results are displayed) Labs Reviewed  CULTURE, GROUP A STREP ([facility])  NOVEL CORONAVIRUS, NAA  POCT RAPID STREP A (OFFICE)    EKG   Radiology No results found.  Procedures Procedures (including critical care time)  Medications Ordered in UC Medications - No data to display  Initial Impression / Assessment and Plan / UC Course  I have reviewed the triage vital signs and the nursing notes.  Pertinent labs & imaging results that were available during my care of the patient were reviewed by me and considered in my medical decision making (see chart for details).   Sore throat, Viral illness.  Rapid strep negative; culture pending. COVID pending.  Instructed patient to self quarantine per CDC guidelines.  Discussed symptomatic treatment including Tylenol or ibuprofen, rest, hydration.  Instructed patient to follow up with PCP if symptoms are not improving.  Patient agrees to plan of care.    Final Clinical Impressions(s) / UC Diagnoses    Final diagnoses:  Sore throat  Viral illness     Discharge Instructions      Your rapid strep test is negative.  A throat culture is  pending; we will call you if it is positive requiring treatment.    Your COVID test is pending.  You should self quarantine until the test result is back.    Take Tylenol or ibuprofen as needed for fever or discomfort.  Rest and keep yourself hydrated.    Follow-up with your primary care provider if your symptoms are not improving.         ED Prescriptions   None    PDMP not reviewed this encounter.   Mickie Bail, NP 12/28/20 [phone]

## 2020-12-28 NOTE — ED Triage Notes (Signed)
Patient c/o sore throat and bilateral ear pain x 1 day.   Patient denies fever at home.   Patient endorses severe pain with swallowing. Patient endorses fatigue.    Patient denies any recent COVID testing.   Patient hasn't taken any medications for symptoms.

## 2020-12-30 LAB — CULTURE, GROUP A STREP (THRC)

## 2020-12-30 LAB — SARS-COV-2, NAA 2 DAY TAT

## 2020-12-30 LAB — NOVEL CORONAVIRUS, NAA: SARS-CoV-2, NAA: NOT DETECTED

## 2021-01-04 ENCOUNTER — Telehealth (HOSPITAL_COMMUNITY): Payer: Self-pay | Admitting: Emergency Medicine

## 2021-01-04 MED ORDER — PENICILLIN V POTASSIUM 500 MG PO TABS: 500.0000 mg | ORAL_TABLET | Freq: Two times a day (BID) | ORAL | 0 refills | Status: AC

## 2021-01-26 ENCOUNTER — Encounter: Payer: Self-pay | Admitting: Advanced Practice Midwife

## 2021-01-26 ENCOUNTER — Other Ambulatory Visit: Payer: Self-pay

## 2021-01-26 ENCOUNTER — Ambulatory Visit: Payer: Self-pay | Admitting: Advanced Practice Midwife

## 2021-01-26 DIAGNOSIS — A599 Trichomoniasis, unspecified: Secondary | ICD-10-CM

## 2021-01-26 DIAGNOSIS — G43909 Migraine, unspecified, not intractable, without status migrainosus: Secondary | ICD-10-CM | POA: Insufficient documentation

## 2021-01-26 DIAGNOSIS — F17200 Nicotine dependence, unspecified, uncomplicated: Secondary | ICD-10-CM

## 2021-01-26 DIAGNOSIS — Z113 Encounter for screening for infections with a predominantly sexual mode of transmission: Secondary | ICD-10-CM

## 2021-01-26 DIAGNOSIS — G43009 Migraine without aura, not intractable, without status migrainosus: Secondary | ICD-10-CM

## 2021-01-26 DIAGNOSIS — F419 Anxiety disorder, unspecified: Secondary | ICD-10-CM

## 2021-01-26 LAB — WET PREP FOR TRICH, YEAST, CLUE
Clue Cell Exam: POSITIVE — AB
Trichomonas Exam: POSITIVE — AB
Yeast Exam: NEGATIVE

## 2021-01-26 MED ORDER — METRONIDAZOLE 500 MG PO TABS: 500.0000 mg | ORAL_TABLET | Freq: Two times a day (BID) | ORAL | 0 refills | Status: AC

## 2021-01-26 NOTE — Progress Notes (Signed)
Pt here for STD screening.  Wet mount results reviewed and medication dispensed per SO.  Nohelia Valenza M Brack Shaddock, RN  

## 2021-01-26 NOTE — Progress Notes (Signed)
[facility] Department STI clinic/screening visit  Subjective:  Valerie Mccarthy is a 30 y.o. SBF G3P0 smoker female being seen today for an STI screening visit. The patient reports they do have symptoms.  Patient reports that they do not desire a pregnancy in the next year.   They reported they are not interested in discussing contraception today.  Patient's last menstrual period was 01/05/2021.   Patient has the following medical conditions:   Patient Active Problem List   Diagnosis Date Noted   Smoker  1 ppd 01/26/2021   Migraines 01/26/2021   Amenorrhea 12/27/2019   History of COVID-19 12/27/2019   Shortness of breath 12/27/2019   Morbid obesity (HCC) 323 lbs 10/22/2018   Menorrhagia with regular cycle 10/22/2018   Anxiety and depression 10/22/2018   Moderate recurrent major depression (HCC) 11/22/2017    Chief Complaint  Patient presents with   SEXUALLY TRANSMITTED DISEASE    screening    HPI  Patient reports she has "a sensitive vagina and my mom washed my clothes with a different detergent and now my vagina is irritated" x a few days. Last sex 01/16/21 without condom; with current partner x 5 mo; 1 partner in last 3 mo. LMP 01/05/21. Last MJ 2019. Last ETOH 01/24/21 (2 beers+1 glass wine) 2-3x/wk. Last vaped 06/2020. Smoking 10-1 ppd. Takes Zoloft prn for depression  Last HIV test per patient/review of record was 06/02/20 Patient reports last pap was 02/12/20 neg HPV neg   See flowsheet for further details and programmatic requirements.    The following portions of the patient's history were reviewed and updated as appropriate: allergies, current medications, past medical history, past social history, past surgical history and problem list.  Objective:  There were no vitals filed for this visit.  Physical Exam Vitals and nursing note reviewed.  Constitutional:      Appearance: Normal appearance. She is obese.  HENT:     Head: Normocephalic and atraumatic.      Mouth/Throat:     Mouth: Mucous membranes are moist.     Pharynx: Oropharynx is clear. No oropharyngeal exudate or posterior oropharyngeal erythema.  Eyes:     Conjunctiva/sclera: Conjunctivae normal.  Pulmonary:     Effort: Pulmonary effort is normal.  Abdominal:     Palpations: Abdomen is soft. There is no mass.     Tenderness: There is no abdominal tenderness. There is no rebound.     Comments: Soft without masses or tenderness, increased adipose, poor tone  Genitourinary:    General: Normal vulva.     Exam position: Lithotomy position.     Pubic Area: No rash or pubic lice.      Labia:        Right: No rash or lesion.        Left: No rash or lesion.      Vagina: Vaginal discharge (grey leukorrhea, ph>4.5) present. No erythema, bleeding or lesions.     Cervix: Normal.     Rectum: Normal.     Comments: Unable to assess due to increased adipose Lymphadenopathy:     Head:     Right side of head: No preauricular or posterior auricular adenopathy.     Left side of head: No preauricular or posterior auricular adenopathy.     Cervical: No cervical adenopathy.     Right cervical: No superficial, deep or posterior cervical adenopathy.    Left cervical: No superficial, deep or posterior cervical adenopathy.     Upper Body:  Right upper body: No supraclavicular or axillary adenopathy.     Left upper body: No supraclavicular or axillary adenopathy.     Lower Body: No right inguinal adenopathy. No left inguinal adenopathy.  Skin:    General: Skin is warm and dry.     Findings: No rash.  Neurological:     Mental Status: She is alert and oriented to person, place, and time.     Assessment and Plan:  Emerly Mccarthy is a 30 y.o. female presenting to the [facility] Department for STI screening  1. Screening examination for venereal disease Treat wet mount per standing orders Immunization nurse consult - WET PREP FOR TRICH, YEAST, CLUE - Chlamydia/Gonorrhea [place]  Lab - Syphilis Serology, [place] [facility] - HIV [place] [facility]  2. Smoker  1 ppd Please give 1800# to pt  3. Migraine without aura and without status migrainosus, not intractable Pt states takes Tylenol daily     No follow-ups on file.  No future appointments.  Alberteen Spindle, CNM

## 2021-02-25 ENCOUNTER — Ambulatory Visit (INDEPENDENT_AMBULATORY_CARE_PROVIDER_SITE_OTHER): Payer: 59 | Admitting: Nurse Practitioner

## 2021-02-25 ENCOUNTER — Encounter: Payer: Self-pay | Admitting: Nurse Practitioner

## 2021-02-25 ENCOUNTER — Other Ambulatory Visit: Payer: Self-pay

## 2021-02-25 ENCOUNTER — Telehealth: Payer: Self-pay | Admitting: *Deleted

## 2021-02-25 VITALS — BP 116/82 | HR 85 | Temp 97.1°F | Resp 14 | Ht 62.0 in | Wt 322.2 lb

## 2021-02-25 DIAGNOSIS — F322 Major depressive disorder, single episode, severe without psychotic features: Secondary | ICD-10-CM

## 2021-02-25 DIAGNOSIS — F411 Generalized anxiety disorder: Secondary | ICD-10-CM

## 2021-02-25 DIAGNOSIS — G43009 Migraine without aura, not intractable, without status migrainosus: Secondary | ICD-10-CM | POA: Diagnosis not present

## 2021-02-25 MED ORDER — SERTRALINE HCL 50 MG PO TABS: 50.0000 mg | ORAL_TABLET | Freq: Every day | ORAL | 1 refills | Status: DC

## 2021-02-25 MED ORDER — BUPROPION HCL ER (XL) 150 MG PO TB24: 150.0000 mg | ORAL_TABLET | Freq: Every day | ORAL | 3 refills | Status: AC

## 2021-02-25 NOTE — Assessment & Plan Note (Signed)
Patient doing with the coloration of things in the social and environmental regard.  Patient did contract for safety with me in regards to behavioral health did provide ample resources including [facility]HE[facility] information and "988" emergency hotline number.  Did discuss starting medications and length of time required to get therapeutic dosing.  Also discussed signs and symptoms of medications with patient we will have close follow-up and also get her in touch with some community resources and therapy.  ED precautions and strict return to clinic precautions discussed with patient and made sure she acknowledged and understood.  Follow-up in 4 weeks

## 2021-02-25 NOTE — Progress Notes (Signed)
Established Patient Office Visit  Subjective:  Patient ID: Valerie Mccarthy, female    DOB: 06/13/1990  Age: 30 y.o. MRN: [id number]  CC:  Chief Complaint  Patient presents with   Transfer of Care   Depression    Needs refill, ran out of medication a few weeks ago.    HPI Valerie Mccarthy presents for   Anxiety and depression: States that she had miscarriage.Brother passed in oct this year and passed at the age of 23. She has not been able to work and recently gotten her car repossed. Facing an eviction Hx of hospitalization several years ago. No history of self harm. Hx of SI but never had a plan or acted on it.   Migraines: 2 a month. Able to take OTC medicaitons that will take care of them Front center and feel it in her eyes. Nausea and light and sound sensitivity when she has them.  Past Medical History:  Diagnosis Date   COVID-19    Depression     No past surgical history on file.  Family History  Problem Relation Age of Onset   Obesity Mother    Diabetes Mother    Arthritis Mother    Lupus Mother    Heart disease Father     Social History   Socioeconomic History   Marital status: Single    Spouse name: Not on file   Number of children: Not on file   Years of education: Not on file   Highest education level: Not on file  Occupational History   Not on file  Tobacco Use   Smoking status: Every Day    Packs/day: 1.00    Types: Cigarettes, E-cigarettes   Smokeless tobacco: Never  Substance and Sexual Activity   Alcohol use: Yes    Alcohol/week: 3.0 standard drinks    Types: 1 Glasses of wine, 2 Cans of beer per week    Comment: last use 01/24/21 2-3x/wk   Drug use: Not Currently    Types: Marijuana    Comment: last use 2019   Sexual activity: Yes    Partners: Male    Birth control/protection: None  Other Topics Concern   Not on file  Social History Narrative   Not on file   Social Determinants of Health   Financial Resource Strain: Not on file   Food Insecurity: Not on file  Transportation Needs: Not on file  Physical Activity: Not on file  Stress: Not on file  Social Connections: Not on file  Intimate Partner Violence: Not on file    Outpatient Medications Prior to Visit  Medication Sig Dispense Refill   buPROPion (WELLBUTRIN XL) 150 MG 24 hr tablet Take 1 tablet (150 mg total) by mouth daily. 30 tablet 3   albuterol (VENTOLIN HFA) 108 (90 Base) MCG/ACT inhaler Inhale 2-4 puffs by mouth every 4 hours as needed for wheezing, cough, and/or shortness of breath 6.7 g 0   HYDROcodone-Chlorpheniramine 5-4 MG/5ML SOLN Take 5 mLs by mouth every 6 (six) hours as needed. (Patient not taking: No sig reported) 473 mL 0   ondansetron (ZOFRAN ODT) 4 MG disintegrating tablet Allow 1-2 tablets to dissolve in your mouth every 8 hours as needed for nausea/vomiting 30 tablet 0   sertraline (ZOLOFT) 50 MG tablet Take 1 tablet (50 mg total) by mouth daily. 30 tablet 3   Vitamin D, Ergocalciferol, (DRISDOL) 1.25 MG (50000 UNIT) CAPS capsule Take 1 capsule (50,000 Units total) by mouth every 7 (seven) days. 12  capsule 3   No facility-administered medications prior to visit.    No Known Allergies  ROS Review of Systems  Constitutional:  Negative for chills and fever.  Respiratory:  Negative for cough and shortness of breath.   Cardiovascular:  Negative for chest pain.  Gastrointestinal:  Negative for diarrhea, nausea and vomiting.  Neurological:  Positive for headaches.  Psychiatric/Behavioral:  Negative for agitation, confusion, hallucinations, self-injury and suicidal ideas.      Objective:    Physical Exam Vitals and nursing note reviewed.  Constitutional:      Appearance: She is obese.  Cardiovascular:     Rate and Rhythm: Normal rate and regular rhythm.  Pulmonary:     Effort: Pulmonary effort is normal.     Breath sounds: Normal breath sounds.  Abdominal:     General: Bowel sounds are normal.  Neurological:     Mental Status:  She is alert.  Psychiatric:        Attention and Perception: Attention normal. She is attentive. She does not perceive auditory or visual hallucinations.        Mood and Affect: Mood is depressed. Affect is tearful.        Speech: Speech normal.        Behavior: Behavior is cooperative.        Thought Content: Thought content normal. Thought content does not include homicidal or suicidal ideation. Thought content does not include homicidal or suicidal plan.        Cognition and Memory: Cognition normal.    BP 116/82   Pulse 85   Temp (!) 97.1 F (36.2 C)   Resp 14   Ht 5\' 2"  (1.575 m)   Wt (!) 322 lb 4 oz (146.2 kg)   LMP 02/02/2021   SpO2 98%   BMI 58.94 kg/m  Wt Readings from Last 3 Encounters:  02/25/21 (!) 322 lb 4 oz (146.2 kg)  02/12/20 (!) 323 lb 8 oz (146.7 kg)  12/27/19 (!) 326 lb (147.9 kg)     Health Maintenance Due  Topic Date Due   COVID-19 Vaccine (1) Never done   Pneumococcal Vaccine 6-40 Years old (1 - PCV) Never done    There are no preventive care reminders to display for this patient.  Lab Results  Component Value Date   TSH 1.89 10/11/2019   Lab Results  Component Value Date   WBC 9.7 12/22/2019   HGB 11.9 (L) 12/22/2019   HCT 38.5 12/22/2019   MCV 79.9 (L) 12/22/2019   PLT 350 12/22/2019   Lab Results  Component Value Date   NA 136 12/22/2019   K 4.1 12/22/2019   CO2 25 12/22/2019   GLUCOSE 94 12/22/2019   BUN 9 12/22/2019   CREATININE 0.78 12/22/2019   BILITOT 0.4 12/22/2019   ALKPHOS 76 12/22/2019   AST 22 12/22/2019   ALT 38 12/22/2019   PROT 7.4 12/22/2019   ALBUMIN 3.4 (L) 12/22/2019   CALCIUM 8.9 12/22/2019   ANIONGAP 9 12/22/2019   GFR 109.15 10/11/2019   Lab Results  Component Value Date   CHOL 177 02/07/2020   Lab Results  Component Value Date   HDL 45.80 02/07/2020   Lab Results  Component Value Date   LDLCALC 111 (H) 02/07/2020   Lab Results  Component Value Date   TRIG 102.0 02/07/2020   Lab Results   Component Value Date   CHOLHDL 4 02/07/2020   Lab Results  Component Value Date   HGBA1C 6.3 02/07/2020  Assessment & Plan:   Problem List Items Addressed This Visit       Cardiovascular and Mediastinum   Migraines    Currently managed with over-the-counter treatments infrequent in nature patient may continue using over-the-counter treatments and will continue to monitor for now.      Relevant Medications   buPROPion (WELLBUTRIN XL) 150 MG 24 hr tablet   sertraline (ZOLOFT) 50 MG tablet     Other   Depression, major, single episode, severe (HCC) - Primary    Patient doing with the coloration of things in the social and environmental regard.  Patient did contract for safety with me in regards to behavioral health did provide ample resources including DHEC information and "988" emergency hotline number.  Did discuss starting medications and length of time required to get therapeutic dosing.  Also discussed signs and symptoms of medications with patient we will have close follow-up and also get her in touch with some community resources and therapy.  ED precautions and strict return to clinic precautions discussed with patient and made sure she acknowledged and understood.  Follow-up in 4 weeks      Relevant Medications   buPROPion (WELLBUTRIN XL) 150 MG 24 hr tablet   sertraline (ZOLOFT) 50 MG tablet   Other Relevant Orders   Ambulatory referral to Psychology   AMB Referral to Community Care Coordinaton   Generalized anxiety disorder    This is coupled with social stressors and depression.  PHQ-9 and GAD-7 administered in office we will start patient on medications which she has since ran out of have close follow-up in office.  Start Zoloft 50 mg daily and Wellbutrin 150 mg daily      Relevant Medications   buPROPion (WELLBUTRIN XL) 150 MG 24 hr tablet   sertraline (ZOLOFT) 50 MG tablet    No orders of the defined types were placed in this encounter.   Follow-up:  Return in about 4 weeks (around 03/25/2021) for recheck.   This visit occurred during the SARS-CoV-2 public health emergency.  Safety protocols were in place, including screening questions prior to the visit, additional usage of staff PPE, and extensive cleaning of exam room while observing appropriate contact time as indicated for disinfecting solutions.   Audria Nine, NP

## 2021-02-25 NOTE — Chronic Care Management (AMB) (Signed)
  Care Management   Note  02/25/2021 Name: Valerie Mccarthy MRN: [id number] DOB: 19-Sep-1990  Valerie Mccarthy is a 30 y.o. year old female who is a primary care patient of No primary care provider on file.. I reached out to [employer] by phone today in response to a referral sent by Ms. Sarai Wiegand's primary care provider.   Ms. Guyett was given information about care management services today including:  Care management services include personalized support from designated clinical staff supervised by her physician, including individualized plan of care and coordination with other care providers 24/7 contact phone numbers for assistance for urgent and routine care needs. The patient may stop care management services at any time by phone call to the office staff.  Patient agreed to services and verbal consent obtained.   Follow up plan: Telephone appointment with care management team member scheduled for: 03/03/2021  Burman Nieves, CCMA Care Guide, Embedded Care Coordination [facility]  Care Management  Direct Dial: [phone]

## 2021-02-25 NOTE — Assessment & Plan Note (Signed)
Currently managed with over-the-counter treatments infrequent in nature patient may continue using over-the-counter treatments and will continue to monitor for now.

## 2021-02-25 NOTE — Patient Instructions (Addendum)
Nice to see you today Call "[phone]" for emergnecy  [address], [place], [place] [address] [facility] [phone]  If things change go be seen immediately

## 2021-02-25 NOTE — Assessment & Plan Note (Signed)
This is coupled with social stressors and depression.  PHQ-9 and GAD-7 administered in office we will start patient on medications which she has since ran out of have close follow-up in office.  Start Zoloft 50 mg daily and Wellbutrin 150 mg daily

## 2021-03-03 ENCOUNTER — Telehealth: Payer: Self-pay | Admitting: Nurse Practitioner

## 2021-03-03 ENCOUNTER — Ambulatory Visit: Payer: 59 | Admitting: *Deleted

## 2021-03-03 DIAGNOSIS — F322 Major depressive disorder, single episode, severe without psychotic features: Secondary | ICD-10-CM

## 2021-03-03 NOTE — Patient Instructions (Signed)
Visit Information   PATIENT GOALS/PLAN OF CARE:  Care Plan : LCSW Plan of Care  Updates made by Deirdre Peer, LCSW since 03/03/2021 12:00 AM     Problem: Symptoms (Depression)      Long-Range Goal: Depression Symptoms Monitored and Managed   Start Date: 03/03/2021  Expected End Date: 05/24/2021  This Visit's Progress: On track  Priority: High  Note:   Current Barriers:  Chronic Mental Health needs related to depression/anxiety and pending eviction, work and social issues Mental Health Concerns  Suicidal Ideation/Homicidal Ideation: No  Clinical Social Work Goal(s):  patient will work with SW  as determined  by telephone to reduce or manage symptoms related to depression/anxiety demonstrate a reduction in symptoms related to :Anxiety with Excessive Worry,, Grief, and Stress   Interventions: CSW spoke with pt today by phone- pt is dealing with a lot of depression; surrounded by grief/loss of a brother in October, a recent mis-carriage and breakup as well as being out of work due to her mental health state; causing her to lose her car and face pending eviction. She also is awaiting word from her employer/HR regarding hopeful FMLA. CSW completed a depression screening with pt today- she scoring 25 (Severe category) but down from 17 a week ago. Pt was recently re-started on medication for depression and is reminded to take as prescribed and to get refilled before she actually runs out.  Pt has a history of depression; from childhood she reports but has been exacerbated with personal issues as mentioned above.  She denies SI/HI and is provided with hotline # to call if needs arise. Pt is agreeable to seeking virtual counseling and a referral will be made to [facility]. Patient interviewed and appropriate assessments performed: PHQ 9 1:1 collaboration with Michela Pitcher, NP regarding development and update of comprehensive plan of care as evidenced by provider attestation and  co-signature Patient interviewed and appropriate assessments performed Referred patient to community resources care guide team for assistance with resources for pending eviction, housing resources, financial support Provided mental health counseling with regard to depression/anxiety  Referred patient to [facility] (mental health provider) for linking pt with long term follow up and therapy/counseling Depression screen reviewed  PHQ2/ PHQ9 completed Active listening / Reflection utilized  Emotional Support Provided Problem [place] strategies reviewed Provided psychoeducation for mental health needs  Reviewed mental health medications with patient and discussed importance of compliance:  Participation in counseling encouraged  Increase in actives / exercise encouraged  Verbalization of feelings encouraged  Discussed referral to [facility] to assist with connecting to mental health provider  Patient Self Care Activities:  Self administers medications as prescribed Attends all scheduled provider appointments Calls pharmacy for medication refills Motivation for treatment  Patient Coping Strengths:  Hopefulness Able to Communicate Effectively  Patient Self Care Deficits:  Lacks social connections  Patient Goals:  - avoid negative self-talk - develop a personal safety plan - develop a plan to deal with triggers like holidays, anniversaries - journal feelings and what helps to feel better or worse - watch for early signs of feeling worse - begin personal counseling - start or continue a personal journal - practice positive thinking and self-talk - arrange a ride through an agency 1 week before appointment - ask family or friend for a ride - call to cancel if needed - keep a calendar with prescription refill dates - keep a calendar with appointment dates - learn the bus route - use public transportation  Follow Up Plan: Appointment scheduled for SW follow up with client  by phone on:  03/09/21     Consent to CCM Services: Ms. Prigmore was given information about Chronic Care Management services including:  CCM service includes personalized support from designated clinical staff supervised by her physician, including individualized plan of care and coordination with other care providers 24/7 contact phone numbers for assistance for urgent and routine care needs. Service will only be billed when office clinical staff spend 20 minutes or more in a month to coordinate care. Only one practitioner may furnish and bill the service in a calendar month. The patient may stop CCM services at any time (effective at the end of the month) by phone call to the office staff. The patient will be responsible for cost sharing (co-pay) of up to 20% of the service fee (after annual deductible is met).  Patient agreed to services and verbal consent obtained.   The patient verbalized understanding of instructions, educational materials, and care plan provided today and declined offer to receive copy of patient instructions, educational materials, and care plan.   Telephone follow up appointment with care management team member scheduled for:03/09/21  Eduard Clos MSW, [place] Licensed Clinical Social Worker [place] [phone]

## 2021-03-03 NOTE — Chronic Care Management (AMB) (Signed)
Chronic Care Management    Clinical Social Work Note  03/03/2021 Name: Valerie Mccarthy MRN: [id number] DOB: [birth year]  Valerie Mccarthy is a 30 y.o. year old female who is a primary care patient of Eden Emms, NP. The CCM team was consulted to assist the patient with chronic disease management and/or care coordination needs related to: Transportation Needs , [employer] , Mental Health Counseling and Resources, and Financial Difficulties   Engaged with patient by telephone for initial visit in response to provider referral for social work chronic care management and care coordination services.   Consent to Services:  The patient was given information about Chronic Care Management services, agreed to services, and gave verbal consent prior to initiation of services.  Please see initial visit note for detailed documentation.   Patient agreed to services and consent obtained.   Assessment: Review of patient past medical history, allergies, medications, and health status, including review of relevant consultants reports was performed today as part of a comprehensive evaluation and provision of chronic care management and care coordination services.     SDOH (Social Determinants of Health) assessments and interventions performed:  SDOH Interventions    Flowsheet Row Most Recent Value  SDOH Interventions   Depression Interventions/Treatment  Medication, Counseling, Currently on Treatment        Advanced Directives Status: Not addressed in this encounter.  CCM Care Plan  No Known Allergies  Outpatient Encounter Medications as of 03/03/2021  Medication Sig   buPROPion (WELLBUTRIN XL) 150 MG 24 hr tablet Take 1 tablet (150 mg total) by mouth daily.   sertraline (ZOLOFT) 50 MG tablet Take 1 tablet (50 mg total) by mouth daily.   No facility-administered encounter medications on file as of 03/03/2021.    Patient Active Problem List   Diagnosis Date Noted   Depression, major,  single episode, severe (HCC) 02/25/2021   Generalized anxiety disorder 02/25/2021   Smoker  1 ppd 01/26/2021   Migraines 01/26/2021   Amenorrhea 12/27/2019   History of COVID-19 12/27/2019   Shortness of breath 12/27/2019   Morbid obesity (HCC) 323 lbs 10/22/2018   Menorrhagia with regular cycle 10/22/2018   Anxiety and depression dx'd 2020 10/22/2018   Moderate recurrent major depression (HCC) 11/22/2017    Conditions to be addressed/monitored: Anxiety and Depression; Financial constraints related to out of work, Limited social support, English as a second language teacher, Housing barriers, and Mental Health Concerns   Care Plan : LCSW Plan of Care  Updates made by Buck Mam, LCSW since 03/03/2021 12:00 AM     Problem: Symptoms (Depression)      Long-Range Goal: Depression Symptoms Monitored and Managed   Start Date: 03/03/2021  Expected End Date: 05/24/2021  This Visit's Progress: On track  Priority: High  Note:   Current Barriers:  Chronic Mental Health needs related to depression/anxiety and pending eviction, work and social issues Mental Health Concerns  Suicidal Ideation/Homicidal Ideation: No  Clinical Social Work Goal(s):  patient will work with SW  as determined  by telephone to reduce or manage symptoms related to depression/anxiety demonstrate a reduction in symptoms related to :Anxiety with Excessive Worry,, Grief, and Stress   Interventions: CSW spoke with pt today by phone- pt is dealing with a lot of depression; surrounded by grief/loss of a brother in October, a recent mis-carriage and breakup as well as being out of work due to her mental health state; causing her to lose her car and face pending eviction. She also is awaiting word from her  employer/HR regarding hopeful FMLA. CSW completed a depression screening with pt today- she scoring 25 (Severe category) but down from 17 a week ago. Pt was recently re-started on medication for depression and is reminded to take as  prescribed and to get refilled before she actually runs out.  Pt has a history of depression; from childhood she reports but has been exacerbated with personal issues as mentioned above.  She denies SI/HI and is provided with hotline # to call if needs arise. Pt is agreeable to seeking virtual counseling and a referral will be made to [facility]. Patient interviewed and appropriate assessments performed: PHQ 9 1:1 collaboration with Eden Emms, NP regarding development and update of comprehensive plan of care as evidenced by provider attestation and co-signature Patient interviewed and appropriate assessments performed Referred patient to community resources care guide team for assistance with resources for pending eviction, housing resources, financial support Provided mental health counseling with regard to depression/anxiety  Referred patient to [facility] (mental health provider) for linking pt with long term follow up and therapy/counseling Depression screen reviewed  PHQ2/ PHQ9 completed Active listening / Reflection utilized  Emotional Support Provided Problem Solving /Task Center strategies reviewed Provided psychoeducation for mental health needs  Reviewed mental health medications with patient and discussed importance of compliance:  Participation in counseling encouraged  Increase in actives / exercise encouraged  Verbalization of feelings encouraged  Discussed referral to [facility] to assist with connecting to mental health provider  Patient Self Care Activities:  Self administers medications as prescribed Attends all scheduled provider appointments Calls pharmacy for medication refills Motivation for treatment  Patient Coping Strengths:  Hopefulness Able to Communicate Effectively  Patient Self Care Deficits:  Lacks social connections  Patient Goals:  - avoid negative self-talk - develop a personal safety plan - develop a plan to deal with triggers like holidays,  anniversaries - journal feelings and what helps to feel better or worse - watch for early signs of feeling worse - begin personal counseling - start or continue a personal journal - practice positive thinking and self-talk - arrange a ride through an agency 1 week before appointment - ask family or friend for a ride - call to cancel if needed - keep a calendar with prescription refill dates - keep a calendar with appointment dates - learn the bus route - use public transportation  Follow Up Plan: Appointment scheduled for SW follow up with client by phone on:  03/09/21      Follow Up Plan: Appointment scheduled for SW follow up with client by phone on: 03/09/21      Reece Levy MSW, LCSW Licensed Clinical Social Worker [facility] [place] [phone]

## 2021-03-03 NOTE — Telephone Encounter (Signed)
Pt called in stating she is checking on status of FMLA paperwork. Pt said it is overdue and she does not want to lose her job. Pt has already emailed the paperwork to office on 11/3

## 2021-03-05 ENCOUNTER — Telehealth: Payer: Self-pay | Admitting: *Deleted

## 2021-03-05 NOTE — Telephone Encounter (Signed)
   Telephone encounter was:  Unsuccessful.  03/05/2021 Name: Valerie Mccarthy MRN: [id number] DOB: 1990/08/21  Unsuccessful outbound call made today to assist with:  Transportation Needs  and Food Insecurity  Outreach Attempt:  1st Attempt  A HIPAA compliant voice message was left requesting a return call.  Instructed patient to call back at   Instructed patient to call back at [phone]  at their earliest convenience. Yehuda Mao Greenauer -[facility] Guide , Embedded Care Coordination [facility], Care Management  [phone] [address] [place] , [place] [address] Email : [email or web address]

## 2021-03-08 NOTE — Telephone Encounter (Signed)
Yes I completed it Friday and I believe I talked to the patient and gave it to Anastasyia to be faxed over

## 2021-03-09 ENCOUNTER — Ambulatory Visit: Payer: 59 | Admitting: *Deleted

## 2021-03-09 ENCOUNTER — Telehealth: Payer: Self-pay | Admitting: *Deleted

## 2021-03-09 DIAGNOSIS — F411 Generalized anxiety disorder: Secondary | ICD-10-CM

## 2021-03-09 DIAGNOSIS — R7303 Prediabetes: Secondary | ICD-10-CM

## 2021-03-09 DIAGNOSIS — F322 Major depressive disorder, single episode, severe without psychotic features: Secondary | ICD-10-CM

## 2021-03-09 NOTE — Telephone Encounter (Signed)
This was already taking care of. See mychart message in the chart and phone note

## 2021-03-09 NOTE — Chronic Care Management (AMB) (Signed)
Chronic Care Management    Clinical Social Work Note  03/09/2021 Name: Valerie Mccarthy MRN: [id number] DOB: 12-04-1990  Valerie Mccarthy is a 30 y.o. year old female who is a primary care patient of Eden Emms, NP. The CCM team was consulted to assist the patient with chronic disease management and/or care coordination needs related to: [employer]  and Mental Health Counseling and Resources.   Engaged with patient by telephone for follow up visit in response to provider referral for social work chronic care management and care coordination services.   Consent to Services:  The patient was given information about Chronic Care Management services, agreed to services, and gave verbal consent prior to initiation of services.  Please see initial visit note for detailed documentation.   Patient agreed to services and consent obtained.   Assessment: Review of patient past medical history, allergies, medications, and health status, including review of relevant consultants reports was performed today as part of a comprehensive evaluation and provision of chronic care management and care coordination services.     SDOH (Social Determinants of Health) assessments and interventions performed:    Advanced Directives Status: Not addressed in this encounter.  CCM Care Plan  No Known Allergies  Outpatient Encounter Medications as of 03/09/2021  Medication Sig   buPROPion (WELLBUTRIN XL) 150 MG 24 hr tablet Take 1 tablet (150 mg total) by mouth daily.   sertraline (ZOLOFT) 50 MG tablet Take 1 tablet (50 mg total) by mouth daily.   No facility-administered encounter medications on file as of 03/09/2021.    Patient Active Problem List   Diagnosis Date Noted   Depression, major, single episode, severe (HCC) 02/25/2021   Generalized anxiety disorder 02/25/2021   Smoker  1 ppd 01/26/2021   Migraines 01/26/2021   Amenorrhea 12/27/2019   History of COVID-19 12/27/2019   Shortness of breath  12/27/2019   Morbid obesity (HCC) 323 lbs 10/22/2018   Menorrhagia with regular cycle 10/22/2018   Anxiety and depression dx'd 2020 10/22/2018   Moderate recurrent major depression (HCC) 11/22/2017    Conditions to be addressed/monitored: Depression; Housing barriers and Mental Health Concerns   Care Plan : LCSW Plan of Care  Updates made by Buck Mam, LCSW since 03/09/2021 12:00 AM     Problem: Symptoms (Depression)      Long-Range Goal: Depression Symptoms Monitored and Managed   Start Date: 03/03/2021  Expected End Date: 05/24/2021  This Visit's Progress: On track  Recent Progress: On track  Priority: High  Note:   Current Barriers:  Chronic Mental Health needs related to depression/anxiety and pending eviction, work and social issues Mental Health Concerns  Suicidal Ideation/Homicidal Ideation: No  Clinical Social Work Goal(s):  patient will work with SW  as determined  by telephone to reduce or manage symptoms related to depression/anxiety demonstrate a reduction in symptoms related to :Anxiety with Excessive Worry,, Grief, and Stress   Interventions: 03/09/21- CSW spoke with pt today by phone- pt is scheduled to have her first counseling session on 03/16/21- virtual visit. Pt reports continued "sadness", denies SI/HI and is awaiting court date to be set for her eviction hearing.  Pt does share that her mom has indicated she can stay with her if needed temporarily but is hesitant because of stress/triggers and overall issues with their relationship.  Encouraged pt to talk with her counselor about this also.  CSW alerted pt to missed calls from our [facility] Guide for housing resources/support- CSW provided her with  the # to callback- will also share the alternate # pt has provided for reaching her [phone]-   03/03/21- Pt is dealing with a lot of depression; surrounded by grief/loss of a brother in October, a recent mis-carriage and breakup as well as being  out of work due to her mental health state; causing her to lose her car and face pending eviction. She also is awaiting word from her employer/HR regarding hopeful FMLA. CSW completed a depression screening with pt today- she scoring 25 (Severe category) but down from 17 a week ago. Pt was recently re-started on medication for depression and is reminded to take as prescribed and to get refilled before she actually runs out.  Pt has a history of depression; from childhood she reports but has been exacerbated with personal issues as mentioned above.  She denies SI/HI and is provided with hotline # to call if needs arise. Pt is agreeable to seeking virtual counseling and a referral will be made to [facility]. Patient interviewed and appropriate assessments performed: PHQ 9 1:1 collaboration with Eden Emms, NP regarding development and update of comprehensive plan of care as evidenced by provider attestation and co-signature Patient interviewed and appropriate assessments performed Referred patient to community resources care guide team for assistance with resources for pending eviction, housing resources, financial support Provided mental health counseling with regard to depression/anxiety  Referred patient to [facility] (mental health provider) for linking pt with long term follow up and therapy/counseling Depression screen reviewed  PHQ2/ PHQ9 completed Active listening / Reflection utilized  Emotional Support Provided Problem Solving /Task Center strategies reviewed Provided psychoeducation for mental health needs  Reviewed mental health medications with patient and discussed importance of compliance:  Participation in counseling encouraged  Increase in actives / exercise encouraged  Verbalization of feelings encouraged  Discussed referral to [facility] to assist with connecting to mental health provider  Patient Self Care Activities:  Self administers medications as prescribed Attends all  scheduled provider appointments Calls pharmacy for medication refills Motivation for treatment  Patient Coping Strengths:  Hopefulness Able to Communicate Effectively  Patient Self Care Deficits:  Lacks social connections  Patient Goals:   -begin your counseling sessions as arranged for 03/16/21 visit -continue to take medications as prescribed -journal each day- emotions/feelings, etc -call 911 for emergencies and/or 988 for mental health crisis- check out counseling - keep 90 percent of counseling appointments -expect phone  call from [facility] Guide to link you with resources - avoid negative self-talk - develop a personal safety plan - develop a plan to deal with triggers like holidays, anniversaries - journal feelings and what helps to feel better or worse - watch for early signs of feeling worse - start or continue a personal journal - practice positive thinking and self-talk - arrange a ride through an agency 1 week before appointment - ask family or friend for a ride - call to cancel if needed - keep a calendar with prescription refill dates - keep a calendar with appointment dates - learn the bus route - use public transportation  Follow Up Plan: Appointment scheduled for SW follow up with client by phone on:  04/08/21      Follow Up Plan: Appointment scheduled for SW follow up with client by phone on: 04/08/21      Reece Levy MSW, LCSW Licensed Clinical Social Worker [facility] [place] [phone]

## 2021-03-09 NOTE — Patient Instructions (Signed)
Visit Information  (Copy and paste patient goals from clinical care plan here)  The patient verbalized understanding of instructions, educational materials, and care plan provided today and declined offer to receive copy of patient instructions, educational materials, and care plan.   Telephone follow up appointment with care management team member scheduled for:04/08/21  Silvana Holecek MSW, LCSW Licensed Clinical Social Worker [facility] [phone]  

## 2021-03-09 NOTE — Telephone Encounter (Signed)
   Telephone encounter was:  Unsuccessful.  03/09/2021 Name: Valerie Mccarthy MRN: [id number] DOB: 04-Oct-1990  Unsuccessful outbound call made today to assist with:  call cannot be completed at this time is the message  Outreach Attempt:  2nd Attempt  call cannot be completed at this time is the message Al[facility] Cliche -[facility] Guide , Embedded Care Coordination [facility], Care Management  [phone] [address] [place] , [place] [address] Email : [email or web address]

## 2021-03-10 ENCOUNTER — Telehealth: Payer: Self-pay | Admitting: *Deleted

## 2021-03-10 NOTE — Telephone Encounter (Signed)
   Telephone encounter was:  Successful.  03/10/2021 Name: Valerie Mccarthy MRN: [id number] DOB: 07/27/1990  Valerie Mccarthy is a 30 y.o. year old female who is a primary care patient of Eden Emms, NP . The community resource team was consulted for assistance with Thank you for taking the time to speak with one of our team members regarding your need for community resources. Will send her the PART bus schedule , eviction notice sent to her to DSS , also needs to complete the food stamp ,Food banks , has a utility of Bill of 200.00.Will reach out again on Friday   Care guide performed the following interventions: Patient provided with information about care guide support team and interviewed to confirm resource needs Follow up call placed to community resources to determine status of patients referral.  Follow Up Plan:  Care guide will follow up with patient by phone over the next days Alois Cliche -[facility] Guide , Embedded Care Coordination [facility], Care Management  [phone] [address] [place] , [place] [address] Email : [email or web address]

## 2021-03-16 NOTE — Telephone Encounter (Signed)
Pt called returning your call  Please call her back at [phone]

## 2021-03-16 NOTE — Telephone Encounter (Signed)
Spoke with patient. Updated phone number in the chart. Completed paperwork and faxed it

## 2021-03-25 ENCOUNTER — Telehealth: Payer: Self-pay | Admitting: *Deleted

## 2021-03-25 NOTE — Telephone Encounter (Signed)
   Telephone encounter was:  Successful.  03/25/2021 Name: Valerie Mccarthy MRN: [id number] DOB: 1990/05/15  Valerie Mccarthy is a 30 y.o. year old female who is a primary care patient of Eden Emms, NP . The community resource team was consulted for assistance with Patient is applying for funds from her job to pay her eviction by the 12th , reinforced she can reach back out to me if there is anything else I can do for her   Care guide performed the following interventions: Patient provided with information about care guide support team and interviewed to confirm resource needs Follow up call placed to community resources to determine status of patients referral.  Follow Up Plan:  No further follow up planned at this time. The patient has been provided with needed resources.  Alois Cliche -[facility] Guide , Embedded Care Coordination [facility], Care Management  [phone] [address] [place] , [place] [address] Email : [email or web address]

## 2021-04-02 ENCOUNTER — Other Ambulatory Visit: Payer: Self-pay

## 2021-04-02 ENCOUNTER — Ambulatory Visit (INDEPENDENT_AMBULATORY_CARE_PROVIDER_SITE_OTHER): Payer: 59 | Admitting: Nurse Practitioner

## 2021-04-02 VITALS — BP 110/62 | HR 78 | Temp 97.4°F | Resp 14 | Ht 62.0 in | Wt 322.1 lb

## 2021-04-02 DIAGNOSIS — F322 Major depressive disorder, single episode, severe without psychotic features: Secondary | ICD-10-CM | POA: Diagnosis not present

## 2021-04-02 MED ORDER — SERTRALINE HCL 50 MG PO TABS: 50.0000 mg | ORAL_TABLET | Freq: Every day | ORAL | 1 refills | Status: AC

## 2021-04-02 NOTE — Patient Instructions (Signed)
Nice to see you today Want to talk to you in 3 months. It can be a virtual or in person visit. We should be back at [facility] at that point If you need me before come back sooner. If you decide the medication is not working or have too many side effects see me sooner.

## 2021-04-02 NOTE — Progress Notes (Signed)
Established Patient Office Visit  Subjective:  Patient ID: Valerie Mccarthy, female    DOB: [birth year]  Age: 30 y.o. MRN: [id number]  CC:  Chief Complaint  Patient presents with   follow up depression and anxiety    Patient is doing phone therapy. Feels like she is doing a little better. Feels really tired on the medication she feels like.    HPI Valerie Mccarthy presents for   Has had two sessions so far Going to every week on wednesdays and on the phone. Started out as every two weeks. Koleen Nimrod based out of [place]. States she will be with her for a little bit and then get with a different therapist.  She is tolerating medication well currenlty. At first she had some ADE that has resolved. State the medication can make her feel tired and like a "zombie" she has been on this therapy before and states that it usually resolves. She is ok with maintaining current therapy.    PHQ9 SCORE ONLY 04/02/2021 03/03/2021 02/25/2021  PHQ-9 Total Score 13 25 27     GAD 7 : Generalized Anxiety Score 04/02/2021 02/25/2021 02/12/2020 11/08/2019  Nervous, Anxious, on Edge 2 3 3  0  Control/stop worrying 3 3 3  0  Worry too much - different things 3 3 3  0  Trouble relaxing 3 3 3  0  Restless 0 3 0 0  Easily annoyed or irritable 2 3 2  0  Afraid - awful might happen 2 3 3  0  Total GAD 7 Score 15 21 17  0  Anxiety Difficulty Very difficult Extremely difficult Extremely difficult Not difficult at all      Past Medical History:  Diagnosis Date   COVID-19    Depression     No past surgical history on file.  Family History  Problem Relation Age of Onset   Obesity Mother    Diabetes Mother    Arthritis Mother    Lupus Mother    Heart disease Father     Social History   Socioeconomic History   Marital status: Single    Spouse name: Not on file   Number of children: Not on file   Years of education: Not on file   Highest education level: Not on file  Occupational History   Not on file   Tobacco Use   Smoking status: Every Day    Packs/day: 0.50    Years: 12.00    Pack years: 6.00    Types: Cigarettes, E-cigarettes   Smokeless tobacco: Never  Substance and Sexual Activity   Alcohol use: Yes    Alcohol/week: 3.0 standard drinks    Types: 1 Glasses of wine, 2 Cans of beer per week    Comment: last use 01/24/21 2-3x/wk   Drug use: Not Currently    Types: Marijuana    Comment: last use 2019   Sexual activity: Yes    Partners: Male    Birth control/protection: None  Other Topics Concern   Not on file  Social History Narrative   Not on file   Social Determinants of Health   Financial Resource Strain: Not on file  Food Insecurity: Not on file  Transportation Needs: Not on file  Physical Activity: Not on file  Stress: Not on file  Social Connections: Not on file  Intimate Partner Violence: Not on file    Outpatient Medications Prior to Visit  Medication Sig Dispense Refill   buPROPion (WELLBUTRIN XL) 150 MG 24 hr tablet Take 1 tablet (150 mg  total) by mouth daily. 30 tablet 3   sertraline (ZOLOFT) 50 MG tablet Take 1 tablet (50 mg total) by mouth daily. 30 tablet 1   No facility-administered medications prior to visit.    No Known Allergies  ROS Review of Systems  Constitutional:  Positive for fatigue. Negative for chills and fever.  Respiratory:  Negative for cough and shortness of breath.   Cardiovascular:  Negative for chest pain.  Gastrointestinal:  Negative for diarrhea, nausea and vomiting.  Neurological:  Negative for headaches.  Psychiatric/Behavioral:  Negative for hallucinations and suicidal ideas.      Objective:    Physical Exam Vitals and nursing note reviewed.  Constitutional:      Appearance: Normal appearance.  Cardiovascular:     Rate and Rhythm: Normal rate and regular rhythm.  Pulmonary:     Effort: Pulmonary effort is normal.     Breath sounds: Normal breath sounds.  Abdominal:     General: Bowel sounds are normal.   Neurological:     Mental Status: She is alert.  Psychiatric:        Attention and Perception: Attention normal.        Mood and Affect: Mood is depressed.        Speech: Speech normal.        Behavior: Behavior is withdrawn.        Thought Content: Thought content normal.        Cognition and Memory: Cognition normal.        Judgment: Judgment normal.    BP 110/62   Pulse 78   Temp (!) 97.4 F (36.3 C)   Resp 14   Ht 5\' 2"  (1.575 m)   Wt (!) 322 lb 2 oz (146.1 kg)   LMP 02/17/2021   SpO2 100%   BMI 58.92 kg/m  Wt Readings from Last 3 Encounters:  04/02/21 (!) 322 lb 2 oz (146.1 kg)  02/25/21 (!) 322 lb 4 oz (146.2 kg)  02/12/20 (!) 323 lb 8 oz (146.7 kg)     Health Maintenance Due  Topic Date Due   COVID-19 Vaccine (1) Never done   Pneumococcal Vaccine 73-72 Years old (1 - PCV) Never done    There are no preventive care reminders to display for this patient.  Lab Results  Component Value Date   TSH 1.89 10/11/2019   Lab Results  Component Value Date   WBC 9.7 12/22/2019   HGB 11.9 (L) 12/22/2019   HCT 38.5 12/22/2019   MCV 79.9 (L) 12/22/2019   PLT 350 12/22/2019   Lab Results  Component Value Date   NA 136 12/22/2019   K 4.1 12/22/2019   CO2 25 12/22/2019   GLUCOSE 94 12/22/2019   BUN 9 12/22/2019   CREATININE 0.78 12/22/2019   BILITOT 0.4 12/22/2019   ALKPHOS 76 12/22/2019   AST 22 12/22/2019   ALT 38 12/22/2019   PROT 7.4 12/22/2019   ALBUMIN 3.4 (L) 12/22/2019   CALCIUM 8.9 12/22/2019   ANIONGAP 9 12/22/2019   GFR 109.15 10/11/2019   Lab Results  Component Value Date   CHOL 177 02/07/2020   Lab Results  Component Value Date   HDL 45.80 02/07/2020   Lab Results  Component Value Date   LDLCALC 111 (H) 02/07/2020   Lab Results  Component Value Date   TRIG 102.0 02/07/2020   Lab Results  Component Value Date   CHOLHDL 4 02/07/2020   Lab Results  Component Value Date   HGBA1C  6.3 02/07/2020      Assessment & Plan:    Problem List Items Addressed This Visit       Other   Depression, major, single episode, severe (HCC) - Primary    Patient has started therapy and seems to be doing okay with it.  Is seeing speaking to a therapist/counselor, started out every other week is being moved weekly.  Patient has SI/HI/AVH.  She did have some ADE with medication but has resolved.  Does state it seems to make her feel tired and "like a zombie".  Did discuss about switching agents with patient would like to stay where she is at currently.  We will have her follow-up in a few months to see how she is doing when she is returned to work.  Did clear her to go back to work on Monday, 04/06/2019      Relevant Medications   sertraline (ZOLOFT) 50 MG tablet    No orders of the defined types were placed in this encounter.   Follow-up: No follow-ups on file.   This visit occurred during the SARS-CoV-2 public health emergency.  Safety protocols were in place, including screening questions prior to the visit, additional usage of staff PPE, and extensive cleaning of exam room while observing appropriate contact time as indicated for disinfecting solutions.   Audria Nine, NP

## 2021-04-02 NOTE — Assessment & Plan Note (Signed)
Patient has started therapy and seems to be doing okay with it.  Is seeing speaking to a therapist/counselor, started out every other week is being moved weekly.  Patient has SI/HI/AVH.  She did have some ADE with medication but has resolved.  Does state it seems to make her feel tired and "like a zombie".  Did discuss about switching agents with patient would like to stay where she is at currently.  We will have her follow-up in a few months to see how she is doing when she is returned to work.  Did clear her to go back to work on Monday, 04/06/2019

## 2021-04-08 ENCOUNTER — Ambulatory Visit: Payer: 59 | Admitting: *Deleted

## 2021-04-08 DIAGNOSIS — F322 Major depressive disorder, single episode, severe without psychotic features: Secondary | ICD-10-CM

## 2021-04-08 DIAGNOSIS — F411 Generalized anxiety disorder: Secondary | ICD-10-CM

## 2021-04-08 DIAGNOSIS — Z8616 Personal history of COVID-19: Secondary | ICD-10-CM

## 2021-04-08 NOTE — Patient Instructions (Signed)
Visit Information  Thank you for taking time to visit with me today. Please don't hesitate to contact me if I can be of assistance to you before our next scheduled telephone appointment.  Following are the goals we discussed today:  (Copy and paste patient goals from clinical care plan here)  Our next appointment is by telephone on 05/05/21 at 10am  Please call the care guide team at [phone] if you need to cancel or reschedule your appointment.   If you are experiencing a Mental Health or Behavioral Health Crisis or need someone to talk to, please call the [place] National Suicide Prevention Lifeline: (779)465-1323 or TTY: 831-063-4701 TTY 864-397-3705) to talk to a trained counselor call 1-800-273-TALK (toll free, 24 hour hotline) call 911   The patient verbalized understanding of instructions, educational materials, and care plan provided today and declined offer to receive copy of patient instructions, educational materials, and care plan.   Reece Levy MSW, LCSW Licensed Clinical Social Worker [facility] [place] [phone]

## 2021-04-08 NOTE — Chronic Care Management (AMB) (Signed)
Chronic Care Management    Clinical Social Work Note  04/08/2021 Name: Valerie Mccarthy Seeney MRN: [id number] DOB: 07/03/1990  Valerie Mccarthy is a 30 y.o. year old female who is a primary care patient of Eden Emms, NP. The CCM team was consulted to assist the patient with chronic disease management and/or care coordination needs related to: Mental Health Counseling and Resources.   Engaged with patient by telephone for follow up visit in response to provider referral for social work chronic care management and care coordination services.   Consent to Services:  The patient was given information about Chronic Care Management services, agreed to services, and gave verbal consent prior to initiation of services.  Please see initial visit note for detailed documentation.   Patient agreed to services and consent obtained.   Assessment: Review of patient past medical history, allergies, medications, and health status, including review of relevant consultants reports was performed today as part of a comprehensive evaluation and provision of chronic care management and care coordination services.     SDOH (Social Determinants of Health) assessments and interventions performed:    Advanced Directives Status: Not addressed in this encounter.  CCM Care Plan  No Known Allergies  Outpatient Encounter Medications as of 04/08/2021  Medication Sig   buPROPion (WELLBUTRIN XL) 150 MG 24 hr tablet Take 1 tablet (150 mg total) by mouth daily.   sertraline (ZOLOFT) 50 MG tablet Take 1 tablet (50 mg total) by mouth daily.   No facility-administered encounter medications on file as of 04/08/2021.    Patient Active Problem List   Diagnosis Date Noted   Depression, major, single episode, severe (HCC) 02/25/2021   Generalized anxiety disorder 02/25/2021   Smoker  1 ppd 01/26/2021   Migraines 01/26/2021   Amenorrhea 12/27/2019   History of COVID-19 12/27/2019   Shortness of breath 12/27/2019   Morbid  obesity (HCC) 323 lbs 10/22/2018   Menorrhagia with regular cycle 10/22/2018   Anxiety and depression dx'd 2020 10/22/2018   Moderate recurrent major depression (HCC) 11/22/2017    Conditions to be addressed/monitored: Depression; Mental Health Concerns   Care Plan : LCSW Plan of Care  Updates made by Buck Mam, LCSW since 04/08/2021 12:00 AM     Problem: Symptoms (Depression)      Long-Range Goal: Depression Symptoms Monitored and Managed   Start Date: 03/03/2021  Expected End Date: 05/24/2021  This Visit's Progress: On track  Recent Progress: On track  Priority: High  Note:   Current Barriers:  Chronic Mental Health needs related to depression/anxiety and pending eviction, work and social issues Mental Health Concerns  Suicidal Ideation/Homicidal Ideation: No  Clinical Social Work Goal(s):  patient will work with SW  as determined  by telephone to reduce or manage symptoms related to depression/anxiety demonstrate a reduction in symptoms related to :Anxiety with Excessive Worry,, Grief, and Stress   Interventions: 04/08/21- pt reports she has returned to work and has had 3 counseling sessions- she is in good spirits and optimistic. Pt was at work and agreed to a follow up next month (off on Wednesdays) to further assess needs.  03/09/21- CSW spoke with pt today by phone- pt is scheduled to have her first counseling session on 03/16/21- virtual visit. Pt reports continued "sadness", denies SI/HI and is awaiting court date to be set for her eviction hearing.  Pt does share that her mom has indicated she can stay with her if needed temporarily but is hesitant because of stress/triggers and overall  issues with their relationship.  Encouraged pt to talk with her counselor about this also.  CSW alerted pt to missed calls from our [facility] Guide for housing resources/support- CSW provided her with the # to callback- will also share the alternate # pt has provided for  reaching her [phone]-   03/03/21- Pt is dealing with a lot of depression; surrounded by grief/loss of a brother in October, a recent mis-carriage and breakup as well as being out of work due to her mental health state; causing her to lose her car and face pending eviction. She also is awaiting word from her employer/HR regarding hopeful FMLA. CSW completed a depression screening with pt today- she scoring 25 (Severe category) but down from 17 a week ago. Pt was recently re-started on medication for depression and is reminded to take as prescribed and to get refilled before she actually runs out.  Pt has a history of depression; from childhood she reports but has been exacerbated with personal issues as mentioned above.  She denies SI/HI and is provided with hotline # to call if needs arise. Pt is agreeable to seeking virtual counseling and a referral will be made to [facility]. Patient interviewed and appropriate assessments performed: PHQ 9 1:1 collaboration with Eden Emms, NP regarding development and update of comprehensive plan of care as evidenced by provider attestation and co-signature Patient interviewed and appropriate assessments performed Referred patient to community resources care guide team for assistance with resources for pending eviction, housing resources, financial support Provided mental health counseling with regard to depression/anxiety  Referred patient to [facility] (mental health provider) for linking pt with long term follow up and therapy/counseling Depression screen reviewed  PHQ2/ PHQ9 completed Active listening / Reflection utilized  Emotional Support Provided Problem Solving /Task Center strategies reviewed Provided psychoeducation for mental health needs  Reviewed mental health medications with patient and discussed importance of compliance:  Participation in counseling encouraged  Increase in actives / exercise encouraged  Verbalization of feelings encouraged   Discussed referral to [facility] to assist with connecting to mental health provider  Patient Self Care Activities:  Self administers medications as prescribed Attends all scheduled provider appointments Calls pharmacy for medication refills Motivation for treatment  Patient Coping Strengths:  Hopefulness Able to Communicate Effectively  Patient Self Care Deficits:  Lacks social connections  Patient Goals:   -continue with your counseling sessions as arranged   -continue to take medications as prescribed -journal each day- emotions/feelings, etc -call 911 for emergencies and/or 988 for mental health crisis- check out counseling - keep 90 percent of counseling appointments -expect phone  call from [facility] Guide to link you with resources - avoid negative self-talk - develop a personal safety plan - develop a plan to deal with triggers like holidays, anniversaries - journal feelings and what helps to feel better or worse - watch for early signs of feeling worse - start or continue a personal journal - practice positive thinking and self-talk - arrange a ride through an agency 1 week before appointment - ask family or friend for a ride - call to cancel if needed - keep a calendar with prescription refill dates - keep a calendar with appointment dates - learn the bus route - use public transportation  Follow Up Plan: Appointment scheduled for SW follow up with client by phone on:  05/05/21      Follow Up Plan: Appointment scheduled for SW follow up with client by phone on: 05/05/21  Reece Levy MSW, LCSW Licensed Clinical Social Worker [facility] [phone]

## 2021-05-05 ENCOUNTER — Ambulatory Visit: Payer: 59 | Admitting: *Deleted

## 2021-05-05 DIAGNOSIS — F322 Major depressive disorder, single episode, severe without psychotic features: Secondary | ICD-10-CM

## 2021-05-05 DIAGNOSIS — F411 Generalized anxiety disorder: Secondary | ICD-10-CM

## 2021-05-05 DIAGNOSIS — Z8616 Personal history of COVID-19: Secondary | ICD-10-CM

## 2021-05-05 DIAGNOSIS — G8929 Other chronic pain: Secondary | ICD-10-CM

## 2021-05-05 DIAGNOSIS — M545 Low back pain, unspecified: Secondary | ICD-10-CM

## 2021-05-05 NOTE — Chronic Care Management (AMB) (Signed)
Chronic Care Management    Clinical Social Work Note  05/05/2021 Name: Valerie Mccarthy MRN: [id number] DOB: 1991/02/03  Valerie Mccarthy is a 31 y.o. year old female who is a primary care patient of Eden Emms, NP. The CCM team was consulted to assist the patient with chronic disease management and/or care coordination needs related to: Mental Health Counseling and Resources.   Engaged with patient by telephone for follow up visit in response to provider referral for social work chronic care management and care coordination services.   Consent to Services:  The patient was given information about Chronic Care Management services, agreed to services, and gave verbal consent prior to initiation of services.  Please see initial visit note for detailed documentation.   Patient agreed to services and consent obtained.   Assessment: Review of patient past medical history, allergies, medications, and health status, including review of relevant consultants reports was performed today as part of a comprehensive evaluation and provision of chronic care management and care coordination services.     SDOH (Social Determinants of Health) assessments and interventions performed:    Advanced Directives Status: Not addressed in this encounter.  CCM Care Plan  No Known Allergies  Outpatient Encounter Medications as of 05/05/2021  Medication Sig   buPROPion (WELLBUTRIN XL) 150 MG 24 hr tablet Take 1 tablet (150 mg total) by mouth daily.   sertraline (ZOLOFT) 50 MG tablet Take 1 tablet (50 mg total) by mouth daily.   No facility-administered encounter medications on file as of 05/05/2021.    Patient Active Problem List   Diagnosis Date Noted   Depression, major, single episode, severe (HCC) 02/25/2021   Generalized anxiety disorder 02/25/2021   Smoker  1 ppd 01/26/2021   Migraines 01/26/2021   Amenorrhea 12/27/2019   History of COVID-19 12/27/2019   Shortness of breath 12/27/2019   Morbid  obesity (HCC) 323 lbs 10/22/2018   Menorrhagia with regular cycle 10/22/2018   Anxiety and depression dx'd 2020 10/22/2018   Moderate recurrent major depression (HCC) 11/22/2017    Conditions to be addressed/monitored: Depression; Mental Health Concerns  and Social Isolation  Care Plan : LCSW Plan of Care  Updates made by Buck Mam, LCSW since 05/05/2021 12:00 AM     Problem: Symptoms (Depression)      Long-Range Goal: Depression Symptoms Monitored and Managed Completed 05/05/2021  Start Date: 03/03/2021  Expected End Date: 05/24/2021  This Visit's Progress: On track  Recent Progress: On track  Priority: High  Note:   Current Barriers:  Chronic Mental Health needs related to depression/anxiety and pending eviction, work and social issues Mental Health Concerns  Suicidal Ideation/Homicidal Ideation: No  Clinical Social Work Goal(s):  patient will work with SW  as determined  by telephone to reduce or manage symptoms related to depression/anxiety demonstrate a reduction in symptoms related to :Anxiety with Excessive Worry,, Grief, and Stress   Interventions: 05/05/21- CSW spoke with pt today who is doing well; aside from a cold/?COVID- she plans to get retested today before returning to work tomorrow.  Pt continues to have weekly counseling sessions and feels this is helping. She is also back at work and engaging, productive and socializing more (also reconnected with a good friend) which she also feels is helping. CSW validated her progress, activity and involvement and the positive impact it has made.  CSW completed depresssion screening with pt today- scoring "5" (mild) and down from 13.  Encouraged pt to continue with her positive steps and  her counseling. Pt agrees to plans for completion of CSW involvement at this time and is appreciative and aware she can reach out through PCP if needs arise. [facility] 05/05/2021 04/02/2021 03/03/2021 02/25/2021 02/12/2020   Decreased Interest 0 1 3 3 1   Down, Depressed, Hopeless 1 1 3 3 1   PHQ - 2 Score 1 2 6 6 2   Altered sleeping 1 3 3 3 1   Tired, decreased energy 1 3 3 3 1   Change in appetite 1 3 3 3  0  Feeling bad or failure about yourself  1 2 3 3 1   Trouble concentrating 0 0 3 3 1   Moving slowly or fidgety/restless 0 0 3 3 0  Suicidal thoughts 0 0 1 3 0  PHQ-9 Score 5 13 25 27 6   Difficult doing work/chores - - Very difficult Extremely dIfficult Extremely dIfficult    04/08/21- pt reports she has returned to work and has had 3 counseling sessions- she is in good spirits and optimistic. Pt was at work and agreed to a follow up next month (off on Wednesdays) to further assess needs.  03/09/21- CSW spoke with pt today by phone- pt is scheduled to have her first counseling session on 03/16/21- virtual visit. Pt reports continued "sadness", denies SI/HI and is awaiting court date to be set for her eviction hearing.  Pt does share that her mom has indicated she can stay with her if needed temporarily but is hesitant because of stress/triggers and overall issues with their relationship.  Encouraged pt to talk with her counselor about this also.  CSW alerted pt [facility]- CSW provided her with the # to callback- will also share the alternate # pt has provided for reaching her [phone]-   03/03/21- Pt is dealing with a lot of depression; surrounded by grief/loss of a brother in October, a recent mis-carriage and breakup as well as being out of work due to her mental health state; causing her to lose her car and face pending eviction. She also is awaiting word from her employer/HR regarding hopeful FMLA. CSW completed a depression screening with pt today- she scoring 25 (Severe category) but down from 17 a week ago. Pt was recently re-started on medication for depression and is reminded to take as prescribed and to get refilled before she actually  runs out.  Pt has a history of depression; from childhood she reports but has been exacerbated with personal issues as mentioned above.  She denies SI/HI and is provided with hotline # to call if needs arise. Pt is agreeable to seeking virtual counseling and a referral will be made to [facility]. Patient interviewed and appropriate assessments performed: PHQ 9 1:1 collaboration with Eden Emmsable, James M, NP regarding development and update of comprehensive plan of care as evidenced by provider attestation and co-signature Patient interviewed and appropriate assessments performed Referred patient to community resources care guide team for assistance with resources for pending eviction, housing resources, financial support Provided mental health counseling with regard to depression/anxiety  Referred patient to [facility] (mental health provider) for linking pt with long term follow up and therapy/counseling Depression screen reviewed  PHQ2/ PHQ9 completed Active listening / Reflection utilized  Emotional Support Provided Problem Solving /Task Center strategies reviewed Provided psychoeducation for mental health needs  Reviewed mental health medications with patient and discussed importance of compliance:  Participation in counseling encouraged  Increase in actives / exercise encouraged  Verbalization of  feelings encouraged  Discussed referral to [facility] to assist with connecting to mental health provider  Patient Self Care Activities:  Self administers medications as prescribed Attends all scheduled provider appointments Calls pharmacy for medication refills Motivation for treatment  Patient Coping Strengths:  Hopefulness Able to Communicate Effectively  Patient Self Care Deficits:  Lacks social connections  Patient Goals:   -continue with your counseling sessions as arranged   -continue to take medications as prescribed -journal each day- emotions/feelings, etc -call 911 for emergencies  and/or 988 for mental health crisis- check out counseling - keep 90 percent of counseling appointments -expect phone  call from [facility] Guide to link you with resources - avoid negative self-talk - develop a personal safety plan - develop a plan to deal with triggers like holidays, anniversaries - journal feelings and what helps to feel better or worse - watch for early signs of feeling worse - start or continue a personal journal - practice positive thinking and self-talk - arrange a ride through an agency 1 week before appointment - ask family or friend for a ride - call to cancel if needed - keep a calendar with prescription refill dates - keep a calendar with appointment dates - learn the bus route - use public transportation  Follow Up Plan: N/A      Follow Up Plan: Client will reach out through PCP office if needs arise      Reece Levy MSW, LCSW Licensed Clinical Social Worker [facility] [place] [phone]

## 2021-05-05 NOTE — Patient Instructions (Signed)
Visit Information  Thank you for taking time to visit with me today. Please don't hesitate to contact me if I can be of assistance to you Following are the goals we discussed today:    Continue with counseling support.    Please call your PCP if needs arise to schedule appointment.   If you are experiencing a Mental Health or [place] or need someone to talk to, please call the Suicide and Crisis Lifeline: 988 call the [place] National Suicide Prevention Lifeline: (872)728-3799 or TTY: 2243476608 TTY 3395462015) to talk to a trained counselor call 1-800-273-TALK (toll free, 24 hour hotline) go to [facility] [address], [place] [id number]) call 911   Patient verbalizes understanding of instructions provided today and agrees to view in [place].   Eduard Clos MSW, LCSW Licensed Clinical Social Worker [place] [phone]

## 2021-05-14 ENCOUNTER — Ambulatory Visit
Admission: RE | Admit: 2021-05-14 | Discharge: 2021-05-14 | Disposition: A | Discharge status: Home / Self Care | Payer: 59 | Source: Ambulatory Visit | Attending: Emergency Medicine | Admitting: Emergency Medicine

## 2021-05-14 ENCOUNTER — Ambulatory Visit: Payer: 59 | Admitting: Nurse Practitioner

## 2021-05-14 ENCOUNTER — Other Ambulatory Visit: Payer: Self-pay

## 2021-05-14 VITALS — BP 102/71 | HR 88 | Temp 98.0°F | Resp 20

## 2021-05-14 DIAGNOSIS — R300 Dysuria: Secondary | ICD-10-CM | POA: Insufficient documentation

## 2021-05-14 DIAGNOSIS — Z113 Encounter for screening for infections with a predominantly sexual mode of transmission: Secondary | ICD-10-CM | POA: Diagnosis not present

## 2021-05-14 LAB — POCT URINALYSIS DIP (MANUAL ENTRY)
Bilirubin, UA: NEGATIVE
Glucose, UA: NEGATIVE mg/dL
Ketones, POC UA: NEGATIVE mg/dL
Nitrite, UA: NEGATIVE
Protein Ur, POC: 100 mg/dL — AB
Spec Grav, UA: 1.02 (ref 1.010–1.025)
Urobilinogen, UA: 0.2 E.U./dL
pH, UA: 6 (ref 5.0–8.0)

## 2021-05-14 LAB — POCT URINE PREGNANCY: Preg Test, Ur: NEGATIVE

## 2021-05-14 MED ORDER — SULFAMETHOXAZOLE-TRIMETHOPRIM 800-160 MG PO TABS
1.0000 | ORAL_TABLET | Freq: Two times a day (BID) | ORAL | 0 refills | Status: AC
Start: 2021-05-14 — End: 2021-05-19

## 2021-05-14 NOTE — ED Triage Notes (Signed)
Pt reports having discomfort to vagina, pt reports having urinary frequency.  Started: Wednesday  Pt requesting STD screening.

## 2021-05-14 NOTE — Discharge Instructions (Addendum)
The urinalysis that we performed today was abnormal.  Urine culture will be performed per our protocol.  The results of urine culture should be available in the next 3 to 5 days and will be posted to your MyChart account.  If there are any abnormal results, you will be contacted by phone and if further treatment is needed, this will be provided for you. ° °If you were advised to begin antibiotics today, it is because you are having active symptoms of a urinary tract infection.  It is important that you complete the course of antibiotics as prescribed despite the results of your urine culture.   ° °The most common reason for a negative urine culture, despite having active symptoms of urinary tract infection, is that a truly reliable urine sample can only be obtained with the first urination of the day.  That early morning urine sample is very concentrated so it is most likely to have a significant amount of bacteria, enough to provide a positive urine culture.  Throughout the day, as you drink fluids and consume foods, your urine becomes more diluted.  Diluted urine decreases the likelihood of a negative urine culture. ° °Therefore, if you begin to have significant relief of your symptoms when you begin the antibiotics but receive a phone call stating that your urine culture was negative, please trust the improvement of your symptoms as a sign that the antibiotics have been effective and that you should complete the entire course. °  °The results of your STD testing today will be made available to you once received.  They will initially be posted to your MyChart and, if any of your results are abnormal, you will receive a phone call with those results along with further instructions regarding treatment. ° °

## 2021-05-14 NOTE — ED Provider Notes (Addendum)
[facility]    CSN: [id number] Arrival date & time: 05/14/21  1317    HISTORY   Chief Complaint  Patient presents with   APPT 1330   Dysuria   Urinary Frequency   HPI Valerie Mccarthy is a 31 y.o. female. Patient complains of vaginal discomfort and increased frequency of urination.  Patient describes discomfort as a burning sensation when urinating.  Patient denies urine malodor, pelvic pain, pelvic pressure, fever, aches, chills, urinary incontinence, incomplete emptying.  Patient states he has not tried any remedies for her symptoms at this time.  Patient tested positive for trichomonas and bacterial vaginitis in October 2022, patient reports resolution of symptoms after treatment.  The history is provided by the patient.  Past Medical History:  Diagnosis Date   COVID-19    Depression    Patient Active Problem List   Diagnosis Date Noted   Depression, major, single episode, severe (HCC) 02/25/2021   Generalized anxiety disorder 02/25/2021   Smoker  1 ppd 01/26/2021   Migraines 01/26/2021   Amenorrhea 12/27/2019   History of COVID-19 12/27/2019   Shortness of breath 12/27/2019   Morbid obesity (HCC) 323 lbs 10/22/2018   Menorrhagia with regular cycle 10/22/2018   Anxiety and depression dx'd 2020 10/22/2018   Moderate recurrent major depression (HCC) 11/22/2017   History reviewed. No pertinent surgical history. OB History   No obstetric history on file.    Home Medications    Prior to Admission medications   Medication Sig Start Date End Date Taking? Authorizing Provider  buPROPion (WELLBUTRIN XL) 150 MG 24 hr tablet Take 1 tablet (150 mg total) by mouth daily. 02/25/21   Eden Emms, NP  sertraline (ZOLOFT) 50 MG tablet Take 1 tablet (50 mg total) by mouth daily. 04/02/21   Eden Emms, NP   Family History Family History  Problem Relation Age of Onset   Obesity Mother    Diabetes Mother    Arthritis Mother    Lupus Mother    Heart disease  Father    Social History Social History   Tobacco Use   Smoking status: Every Day    Packs/day: 0.50    Years: 12.00    Pack years: 6.00    Types: Cigarettes, E-cigarettes   Smokeless tobacco: Never  Substance Use Topics   Alcohol use: Yes    Alcohol/week: 3.0 standard drinks    Types: 1 Glasses of wine, 2 Cans of beer per week    Comment: last use 01/24/21 2-3x/wk   Drug use: Not Currently    Types: Marijuana    Comment: last use 2019   Allergies   Patient has no known allergies.  Review of Systems Review of Systems Pertinent findings noted in history of present illness.   Physical Exam Triage Vital Signs ED Triage Vitals  Enc Vitals Group     BP 02/19/21 0827 (!) 147/82     Pulse Rate 02/19/21 0827 72     Resp 02/19/21 0827 18     Temp 02/19/21 0827 98.3 F (36.8 C)     Temp Source 02/19/21 0827 Oral     SpO2 02/19/21 0827 98 %     Weight --      Height --      Head Circumference --      Peak Flow --      Pain Score 02/19/21 0826 5     Pain Loc --      Pain Edu? --  Excl. in GC? --   No data found.  Updated Vital Signs BP 102/71 (BP Location: Left Arm)    Pulse 88    Temp 98 F (36.7 C) (Oral)    Resp 20    LMP 04/25/2021    SpO2 97%   Physical Exam Vitals and nursing note reviewed.  Constitutional:      General: She is not in acute distress.    Appearance: Normal appearance. She is morbidly obese. She is not ill-appearing.  HENT:     Head: Normocephalic and atraumatic.  Eyes:     General: Lids are normal.        Right eye: No discharge.        Left eye: No discharge.     Extraocular Movements: Extraocular movements intact.     Conjunctiva/sclera: Conjunctivae normal.     Right eye: Right conjunctiva is not injected.     Left eye: Left conjunctiva is not injected.  Neck:     Trachea: Trachea and phonation normal.  Cardiovascular:     Rate and Rhythm: Normal rate and regular rhythm.     Pulses: Normal pulses.     Heart sounds: Normal heart  sounds. No murmur heard.   No friction rub. No gallop.  Pulmonary:     Effort: Pulmonary effort is normal. No accessory muscle usage, prolonged expiration or respiratory distress.     Breath sounds: Normal breath sounds. No stridor, decreased air movement or transmitted upper airway sounds. No decreased breath sounds, wheezing, rhonchi or rales.  Chest:     Chest wall: No tenderness.  Abdominal:     General: Abdomen is flat. Bowel sounds are normal. There is no distension.     Palpations: Abdomen is soft.     Tenderness: There is no abdominal tenderness. There is no right CVA tenderness or left CVA tenderness.     Hernia: No hernia is present.  Musculoskeletal:        General: Normal range of motion.     Cervical back: Normal range of motion and neck supple. Normal range of motion.  Lymphadenopathy:     Cervical: No cervical adenopathy.  Skin:    General: Skin is warm and dry.     Findings: No erythema or rash.  Neurological:     General: No focal deficit present.     Mental Status: She is alert and oriented to person, place, and time.  Psychiatric:        Mood and Affect: Mood normal.        Behavior: Behavior normal.    Visual Acuity Right Eye Distance:   Left Eye Distance:   Bilateral Distance:    Right Eye Near:   Left Eye Near:    Bilateral Near:     UC Couse / Diagnostics / Procedures:    EKG  Radiology No results found.  Procedures Procedures (including critical care time)  UC Diagnoses / Final Clinical Impressions(s)   I have reviewed the triage vital signs and the nursing notes.  Pertinent labs & imaging results that were available during my care of the patient were reviewed by me and considered in my medical decision making (see chart for details).    Final diagnoses:  Dysuria  Screening examination for STD (sexually transmitted disease)   Urine dip today was abnormal. Urine culture will be performed per our protocol.  Patient advised that they will  be contacted with results and that adjustments to treatment will be provided as  indicated based on the results.  Patient will begin Bactrim while we await the results of her urine culture.  Antibiotic will be adjusted as needed based on culture results.  Patient was advised of possibility that urine culture results may be negative if sample provided was obtained late in the day causing urine to be more diluted.  Patient was advised that if antibiotics were effective after the first 24 to 36 hours, despite negative urine culture result, it is recommended that they complete the full course as prescribed.  Return precautions advised.  Drug allergies reviewed, all questions addressed.  ED Prescriptions     Medication Sig Dispense Auth. Provider   sulfamethoxazole-trimethoprim (BACTRIM DS) 800-160 MG tablet Take 1 tablet by mouth 2 (two) times daily for 5 days. 10 tablet Theadora RamaMorgan, Royalty Domagala Scales, PA-C      PDMP not reviewed this encounter.  Pending results:  Labs Reviewed  POCT URINALYSIS DIP (MANUAL ENTRY) - Abnormal; Notable for the following components:      Result Value   Clarity, UA cloudy (*)    Blood, UA large (*)    Protein Ur, POC =100 (*)    Leukocytes, UA Large (3+) (*)    All other components within normal limits  URINE CULTURE  RPR  HIV ANTIBODY (ROUTINE TESTING W REFLEX)  POCT URINE PREGNANCY  CERVICOVAGINAL ANCILLARY ONLY    Medications Ordered in UC: Medications - No data to display  Disposition Upon Discharge:  Condition: stable for discharge home Home: take medications as prescribed; routine discharge instructions as discussed; follow up as advised.  Patient presented with an acute illness with associated systemic symptoms and significant discomfort requiring urgent management. In my opinion, this is a condition that a prudent lay person (someone who possesses an average knowledge of health and medicine) may potentially expect to result in complications if not addressed  urgently such as respiratory distress, impairment of bodily function or dysfunction of bodily organs.   Routine symptom specific, illness specific and/or disease specific instructions were discussed with the patient and/or caregiver at length.   As such, the patient has been evaluated and assessed, work-up was performed and treatment was provided in alignment with urgent care protocols and evidence based medicine.  Patient/parent/caregiver has been advised that the patient may require follow up for further testing and treatment if the symptoms continue in spite of treatment, as clinically indicated and appropriate.  Patient/parent/caregiver has been advised to [facility] in 3-5 days if no better; to PCP or the Emergency Department if new signs and symptoms develop, or if the current signs or symptoms continue to change or worsen for further workup, evaluation and treatment as clinically indicated and appropriate  The patient will follow up with their current PCP if and as advised. If the patient does not currently have a PCP we will assist them in obtaining one.   The patient may need specialty follow up if the symptoms continue, in spite of conservative treatment and management, for further workup, evaluation, consultation and treatment as clinically indicated and appropriate.  Patient/parent/caregiver verbalized understanding and agreement of plan as discussed.  All questions were addressed during visit.  Please see discharge instructions below for further details of plan.  Discharge Instructions:   Discharge Instructions      The urinalysis that we performed today was abnormal.  Urine culture will be performed per our protocol.  The results of urine culture should be available in the next 3 to 5 days  and will be posted to your MyChart account.  If there are any abnormal results, you will be contacted by phone and if further treatment is needed, this will be provided for  you.  If you were advised to begin antibiotics today, it is because you are having active symptoms of a urinary tract infection.  It is important that you complete the course of antibiotics as prescribed despite the results of your urine culture.    The most common reason for a negative urine culture, despite having active symptoms of urinary tract infection, is that a truly reliable urine sample can only be obtained with the first urination of the day.  That early morning urine sample is very concentrated so it is most likely to have a significant amount of bacteria, enough to provide a positive urine culture.  Throughout the day, as you drink fluids and consume foods, your urine becomes more diluted.  Diluted urine decreases the likelihood of a negative urine culture.  Therefore, if you begin to have significant relief of your symptoms when you begin the antibiotics but receive a phone call stating that your urine culture was negative, please trust the improvement of your symptoms as a sign that the antibiotics have been effective and that you should complete the entire course.  The results of your STD testing today will be made available to you once received.  They will initially be posted to your MyChart and, if any of your results are abnormal, you will receive a phone call with those results along with further instructions regarding treatment.       This office note has been dictated using Teaching laboratory technicianDragon speech recognition software.  Unfortunately, and despite my best efforts, this method of dictation can sometimes lead to occasional typographical or grammatical errors.  I apologize in advance if this occurs.      Theadora RamaMorgan, Asha Grumbine Scales, PA-C 05/14/21 1415    Theadora RamaMorgan, Darl Kuss Scales, PA-C 05/14/21 1416

## 2021-05-15 LAB — HIV ANTIBODY (ROUTINE TESTING W REFLEX): HIV Screen 4th Generation wRfx: NONREACTIVE

## 2021-05-15 LAB — RPR: RPR Ser Ql: NONREACTIVE

## 2021-05-16 LAB — URINE CULTURE: Culture: >=100000 — AB

## 2021-05-17 LAB — CERVICOVAGINAL ANCILLARY ONLY
Bacterial Vaginitis (gardnerella): POSITIVE — AB
Candida Glabrata: NEGATIVE
Candida Vaginitis: NEGATIVE
Chlamydia: NEGATIVE
Comment: NEGATIVE
Comment: NEGATIVE
Comment: NEGATIVE
Comment: NEGATIVE
Comment: NEGATIVE
Comment: NORMAL
Neisseria Gonorrhea: NEGATIVE
Trichomonas: NEGATIVE

## 2021-05-18 ENCOUNTER — Telehealth (HOSPITAL_COMMUNITY): Payer: Self-pay | Admitting: Emergency Medicine

## 2021-05-18 MED ORDER — METRONIDAZOLE 500 MG PO TABS: 500.0000 mg | ORAL_TABLET | Freq: Two times a day (BID) | ORAL | 0 refills | Status: AC

## 2021-06-16 ENCOUNTER — Encounter: Payer: Self-pay | Admitting: Nurse Practitioner

## 2021-06-16 NOTE — Telephone Encounter (Signed)
I know I filled out some papers in regards to her leave when I was seeing her. When we talked in office I wrote down 12/28/2020. I cannot change that date. It is on her FMLA papers

## 2021-10-23 IMAGING — CR DG CHEST 2V
1 series · 2 of 2 positions shown · non-contrast
Comparison: None.

CLINICAL DATA: Cough, shortness of breath.

EXAM:
CHEST - 2 VIEW

[Series 1: dg chest 2 view · 0.14mm/px · 2 of 2 slices shown]
[im 1/2]
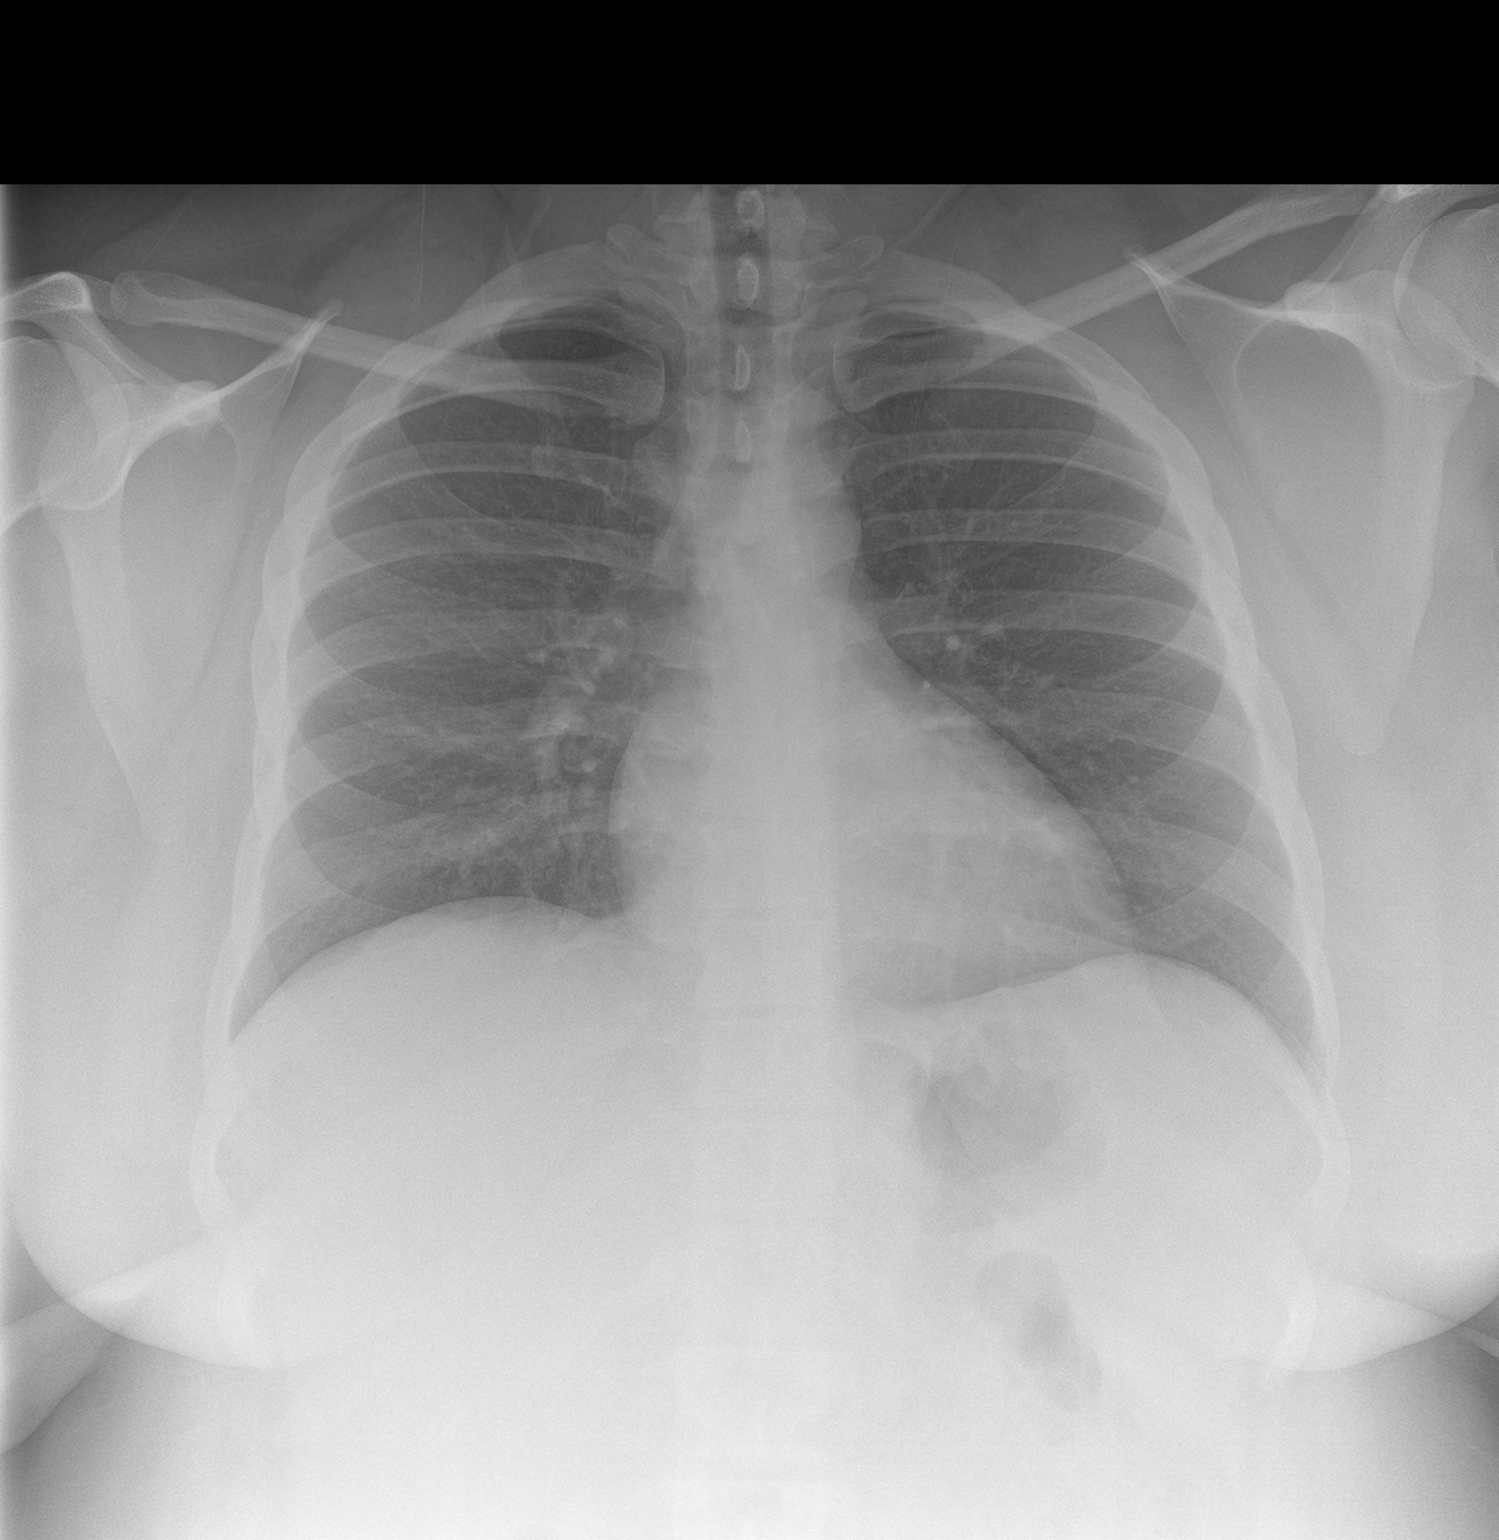
[im 2/2]
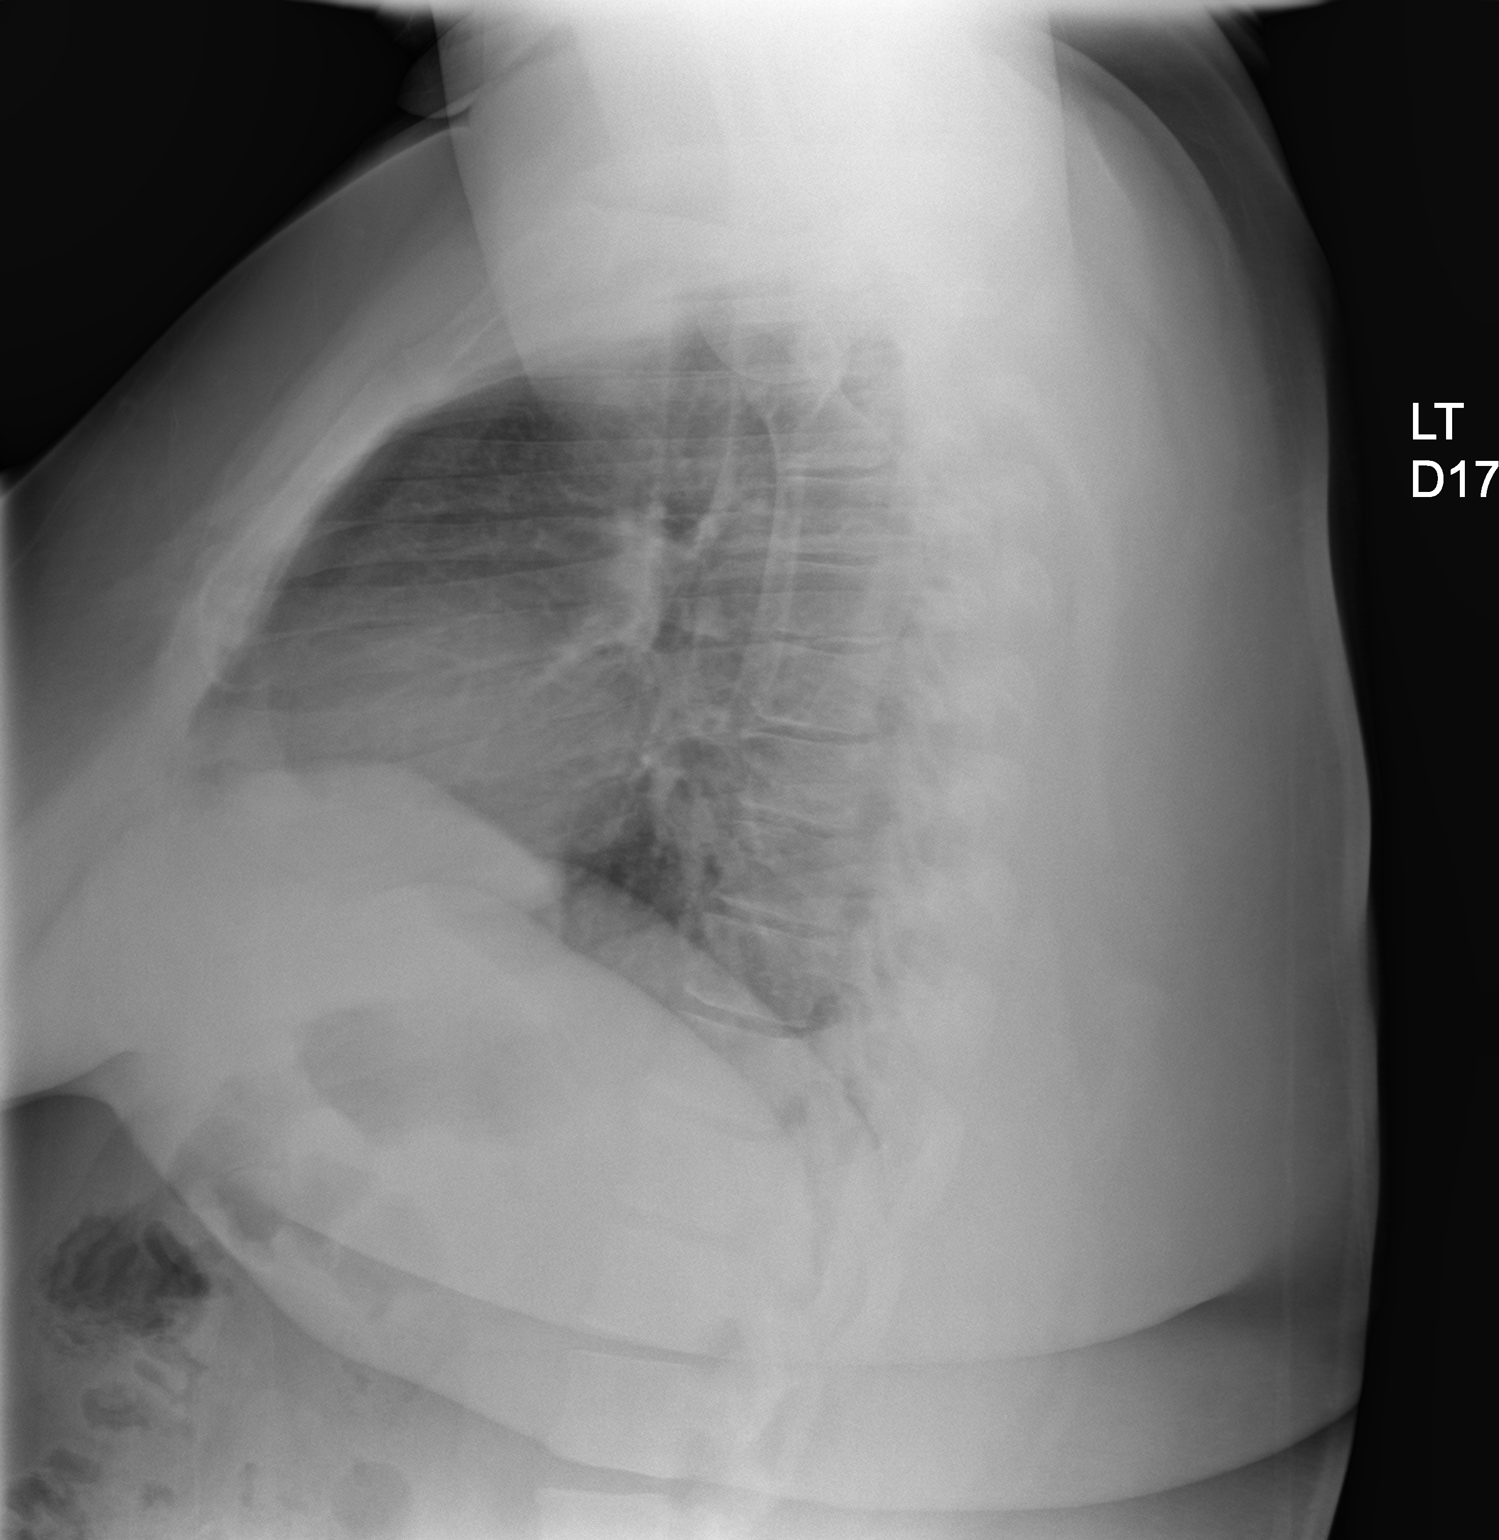

[2 of 2 positions shown; findings below may reference images not displayed]

FINDINGS: The heart size and mediastinal contours are within normal limits.
Both lungs are clear. No pneumothorax or pleural effusion is noted.
The visualized skeletal structures are unremarkable.
IMPRESSION: No active cardiopulmonary disease.

## 2021-10-29 IMAGING — DX DG CHEST 1V PORT
1 series · 1 of 1 positions shown · non-contrast
Comparison: 12/18/2019

CLINICAL DATA: Dyspnea, cough, [id number] positive 12/04/2019

EXAM:
PORTABLE CHEST 1 VIEW

[chest ap]
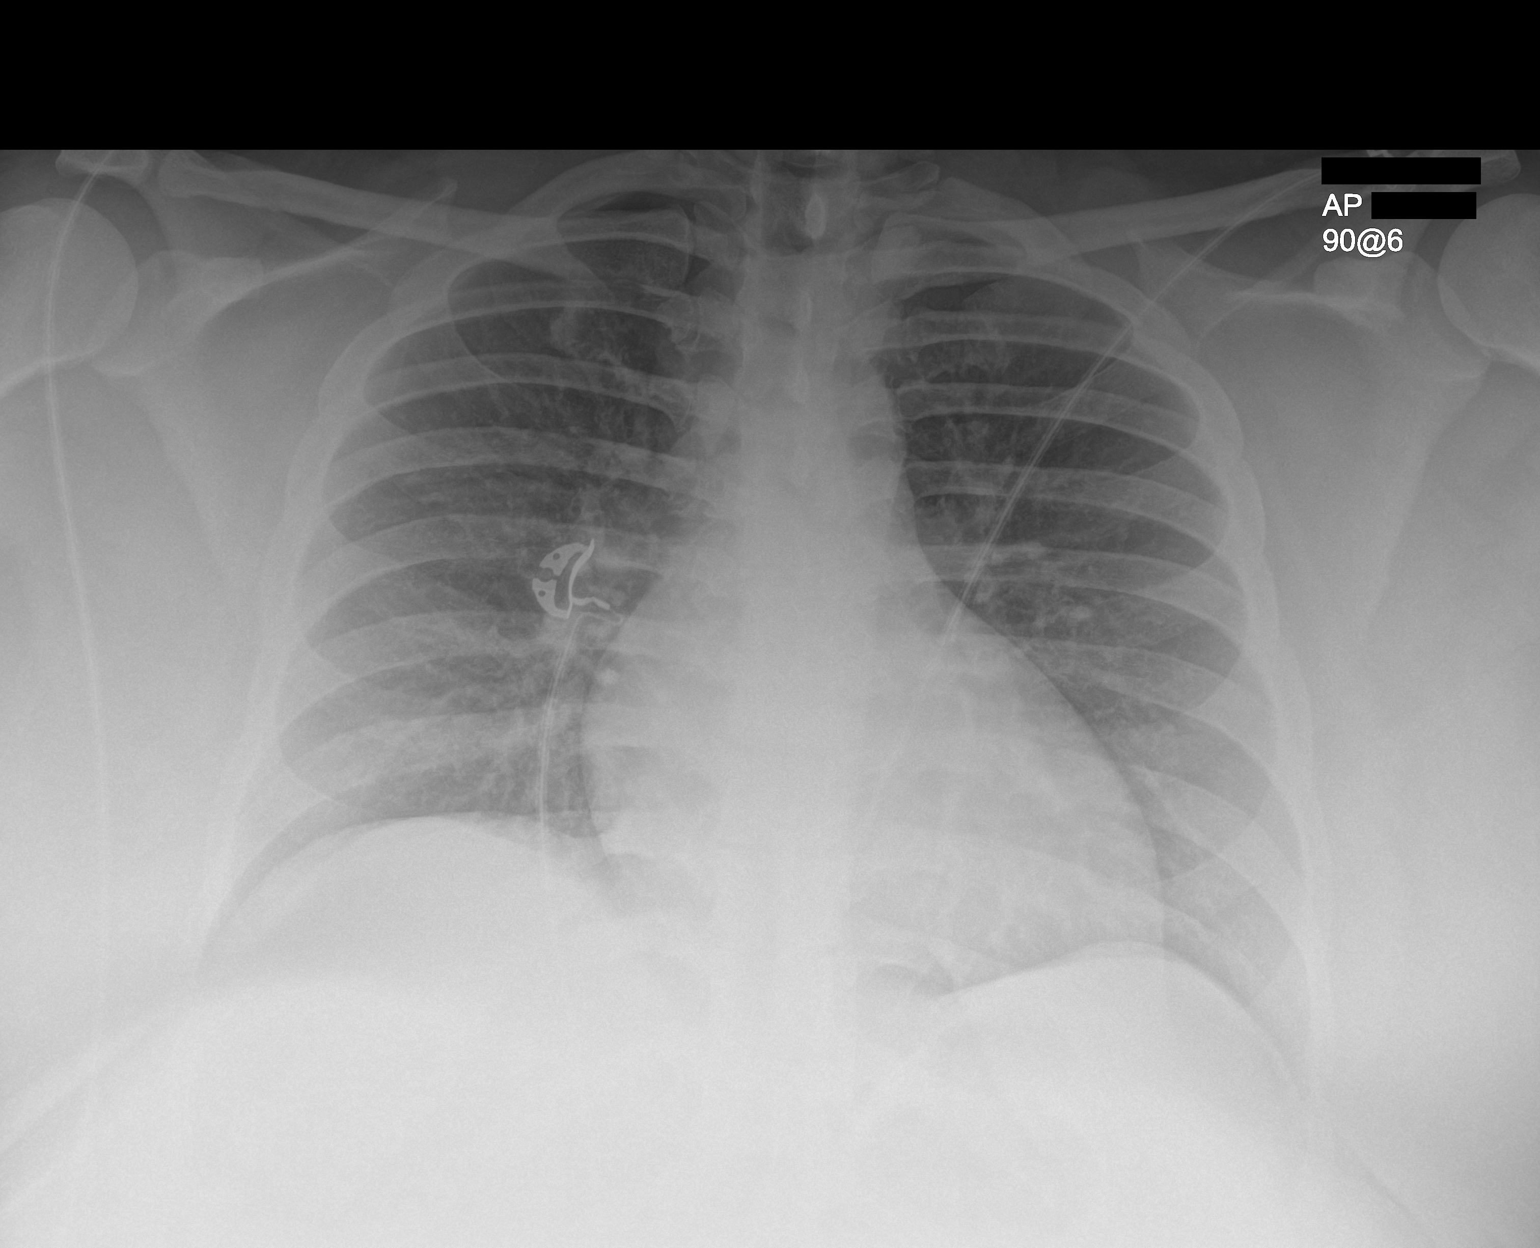

[1 of 1 positions shown; findings below may reference images not displayed]

FINDINGS: The heart size and mediastinal contours are within normal limits.
Both lungs are clear. The visualized skeletal structures are
unremarkable.
IMPRESSION: No active disease.
